# Patient Record
Sex: Female | Born: 2000 | Race: Black or African American | Hispanic: No | Marital: Married | Smoking: Never smoker
Health system: Southern US, Community
[De-identification: ages and names within clinical notes are randomized; demographics above are authoritative.]

## PROBLEM LIST (undated history)

## (undated) DIAGNOSIS — Z789 Other specified health status: Secondary | ICD-10-CM

## (undated) DIAGNOSIS — D649 Anemia, unspecified: Secondary | ICD-10-CM

## (undated) HISTORY — DX: Anemia, unspecified: D64.9

## (undated) HISTORY — PX: WISDOM TOOTH EXTRACTION: SHX21

## (undated) HISTORY — PX: TONSILLECTOMY: SUR1361

---
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
## 2020-01-04 ENCOUNTER — Encounter: Payer: Self-pay | Admitting: Obstetrics

## 2020-01-04 ENCOUNTER — Other Ambulatory Visit: Payer: Self-pay

## 2020-01-04 ENCOUNTER — Ambulatory Visit (INDEPENDENT_AMBULATORY_CARE_PROVIDER_SITE_OTHER): Payer: 59 | Admitting: Obstetrics

## 2020-01-04 ENCOUNTER — Other Ambulatory Visit (HOSPITAL_COMMUNITY)
Admission: RE | Admit: 2020-01-04 | Discharge: 2020-01-04 | Disposition: A | Discharge status: Home / Self Care | Payer: 59 | Source: Ambulatory Visit | Attending: Obstetrics | Admitting: Obstetrics

## 2020-01-04 VITALS — BP 113/69 | HR 87 | Wt 160.0 lb

## 2020-01-04 DIAGNOSIS — O30009 Twin pregnancy, unspecified number of placenta and unspecified number of amniotic sacs, unspecified trimester: Secondary | ICD-10-CM | POA: Diagnosis not present

## 2020-01-04 DIAGNOSIS — O0990 Supervision of high risk pregnancy, unspecified, unspecified trimester: Secondary | ICD-10-CM

## 2020-01-04 DIAGNOSIS — Z3A15 15 weeks gestation of pregnancy: Secondary | ICD-10-CM | POA: Diagnosis not present

## 2020-01-04 DIAGNOSIS — O0992 Supervision of high risk pregnancy, unspecified, second trimester: Secondary | ICD-10-CM

## 2020-01-04 MED ORDER — PRENATE MINI 29-0.6-0.4-350 MG PO CAPS: 1.0000 | ORAL_CAPSULE | Freq: Every day | ORAL | 3 refills | Status: DC

## 2020-01-04 MED ORDER — FOLIC ACID 1 MG PO TABS: 1.0000 mg | ORAL_TABLET | Freq: Every day | ORAL | 3 refills | Status: DC

## 2020-01-04 MED ORDER — FERROUS SULFATE 325 (65 FE) MG PO TABS: 325.0000 mg | ORAL_TABLET | Freq: Two times a day (BID) | ORAL | 5 refills | Status: DC

## 2020-01-04 MED ORDER — BLOOD PRESSURE MONITOR KIT
1.0000 | PACK | 0 refills | Status: DC
Start: 2020-01-04 — End: 2020-06-08

## 2020-01-04 NOTE — Progress Notes (Signed)
NOB TWINS   Genetic Screening: Desires Wants to know gender.    CC: None

## 2020-01-04 NOTE — Addendum Note (Signed)
Addended by: Kennon Portela on: 01/04/2020 02:42 PM   Modules accepted: Orders

## 2020-01-04 NOTE — Progress Notes (Addendum)
Subjective:    Kathy Harris is being seen today for her first obstetrical visit.  This is not a planned pregnancy. She is at [redacted]w[redacted]d gestation. Her obstetrical history is significant for none. Relationship with FOB: significant other, not living together. Patient does intend to breast feed. Pregnancy history fully reviewed.  The information documented in the HPI was reviewed and verified.  Menstrual History: OB History    Gravida  2   Para      Term      Preterm      AB  1   Living        SAB  1   TAB      Ectopic      Multiple      Live Births               Patient's last menstrual period was 09/15/2019.    Past Medical History:  Diagnosis Date  . Medical history non-contributory     Past Surgical History:  Procedure Laterality Date  . TONSILLECTOMY      (Not in a hospital admission)  No Known Allergies  Social History   Tobacco Use  . Smoking status: Never Smoker  . Smokeless tobacco: Never Used  Substance Use Topics  . Alcohol use: Never    Family History  Problem Relation Age of Onset  . Heart murmur Mother   . Diabetes Father   . Hypertension Father      Review of Systems Constitutional: negative for weight loss Gastrointestinal: negative for vomiting Genitourinary:negative for genital lesions and vaginal discharge and dysuria Musculoskeletal:negative for back pain Behavioral/Psych: negative for abusive relationship, depression, illegal drug usage and tobacco use    Objective:    BP 113/69   Pulse 87   Wt 160 lb (72.6 kg)   LMP 09/15/2019   BMI 31.25 kg/m  General Appearance:    Alert, cooperative, no distress, appears stated age  Head:    Normocephalic, without obvious abnormality, atraumatic  Eyes:    PERRL, conjunctiva/corneas clear, EOM's intact, fundi    benign, both eyes  Ears:    Normal TM's and external ear canals, both ears  Nose:   Nares normal, septum midline, mucosa normal, no drainage    or sinus tenderness   Throat:   Lips, mucosa, and tongue normal; teeth and gums normal  Neck:   Supple, symmetrical, trachea midline, no adenopathy;    thyroid:  no enlargement/tenderness/nodules; no carotid   bruit or JVD  Back:     Symmetric, no curvature, ROM normal, no CVA tenderness  Lungs:     Clear to auscultation bilaterally, respirations unlabored  Chest Wall:    No tenderness or deformity   Heart:    Regular rate and rhythm, S1 and S2 normal, no murmur, rub   or gallop  Breast Exam:    No tenderness, masses, or nipple abnormality  Abdomen:     Soft, non-tender, bowel sounds active all four quadrants,    no masses, no organomegaly  Genitalia:    Normal female without lesion, discharge or tenderness  Extremities:   Extremities normal, atraumatic, no cyanosis or edema  Pulses:   2+ and symmetric all extremities  Skin:   Skin color, texture, turgor normal, no rashes or lesions  Lymph nodes:   Cervical, supraclavicular, and axillary nodes normal  Neurologic:   CNII-XII intact, normal strength, sensation and reflexes    throughout      Lab Review Urine pregnancy test  Labs reviewed yes Radiologic studies reviewed yes  Assessment:    Pregnancy at [redacted]w[redacted]d weeks .Twins   Plan:     1. Supervision of high risk pregnancy, antepartum  2. Twin pregnancy, antepartum, unspecified multiple gestation type Rx: - Culture, OB Urine - Genetic Screening - Cervicovaginal ancillary only( [facility]) - Enroll Patient in Babyscripts - CBC/D/Plt+RPR+Rh+ABO+Rub Ab... - AFP, Serum, Open Spina Bifida - [place] MFM OB DETAIL +14 WK; Future - Prenat w/o A-FeCbn-Meth-FA-DHA (PRENATE MINI) 29-0.6-0.4-350 MG CAPS; Take 1 capsule by mouth daily before breakfast.  Dispense: 90 capsule; Refill: 3 - folic acid (FOLVITE) 1 MG tablet; Take 1 tablet (1 mg total) by mouth daily.  Dispense: 90 tablet; Refill: 3 - ferrous sulfate 325 (65 FE) MG tablet; Take 1 tablet (325 mg total) by mouth 2 (two) times daily with a meal.  Dispense:  60 tablet; Refill: 5 - [place] MFM OB DETAIL ADDL GEST +14 WK; Future   Prenatal vitamins.  Counseling provided regarding continued use of seat belts, cessation of alcohol consumption, smoking or use of illicit drugs; infection precautions i.e., influenza/TDAP immunizations, toxoplasmosis,CMV, parvovirus, listeria and varicella; workplace safety, exercise during pregnancy; routine dental care, safe medications, sexual activity, hot tubs, saunas, pools, travel, caffeine use, fish and methlymercury, potential toxins, hair treatments, varicose veins Weight gain recommendations per IOM guidelines reviewed: underweight/BMI< 18.5--> gain 28 - 40 lbs; normal weight/BMI 18.5 - 24.9--> gain 25 - 35 lbs; overweight/BMI 25 - 29.9--> gain 15 - 25 lbs; obese/BMI >30->gain  11 - 20 lbs Problem list reviewed and updated. FIRST/CF mutation testing/NIPT/QUAD SCREEN/fragile X/Ashkenazi Jewish population testing/Spinal muscular atrophy discussed: requested. Role of ultrasound in pregnancy discussed; fetal survey: requested. Amniocentesis discussed: not indicated.  Meds ordered this encounter  Medications  . Prenat w/o A-FeCbn-Meth-FA-DHA (PRENATE MINI) 29-0.6-0.4-350 MG CAPS    Sig: Take 1 capsule by mouth daily before breakfast.    Dispense:  90 capsule    Refill:  3  . folic acid (FOLVITE) 1 MG tablet    Sig: Take 1 tablet (1 mg total) by mouth daily.    Dispense:  90 tablet    Refill:  3  . ferrous sulfate 325 (65 FE) MG tablet    Sig: Take 1 tablet (325 mg total) by mouth 2 (two) times daily with a meal.    Dispense:  60 tablet    Refill:  5   Orders Placed This Encounter  Procedures  . Culture, OB Urine  . [place] MFM OB DETAIL +14 WK    Standing Status:   Future    Standing Expiration Date:   01/03/2021    Order Specific Question:   Reason for Exam (SYMPTOM  OR DIAGNOSIS REQUIRED)    Answer:   Twins    Order Specific Question:   Preferred Location    Answer:   [facility] Ultrasound  . Genetic Screening     PANORAMA  . CBC/D/Plt+RPR+Rh+ABO+Rub Ab...  . AFP, Serum, Open Spina Bifida    Order Specific Question:   Is patient insulin dependent?    Answer:   No    Order Specific Question:   Patient weight (lb.)    Answer:   160 lb (72.6 kg)    Order Specific Question:   Gestational Age (GA), weeks    Answer:   67    Order Specific Question:   Date on which patient was at this GA    Answer:   01/04/2020    Order Specific Question:   GA Calculation  Method    Answer:   LMP    Order Specific Question:   Number of fetuses    Answer:   2    Order Specific Question:   Donor egg?    Answer:   N    Follow up in 4 weeks. 50% of 25 min visit spent on counseling and coordination of care.    Brock Bad, MD 01/04/2020 2:16 PM

## 2020-01-04 NOTE — Addendum Note (Signed)
Addended by: Brock Bad on: 01/04/2020 02:33 PM   Modules accepted: Orders

## 2020-01-05 LAB — CERVICOVAGINAL ANCILLARY ONLY
Bacterial Vaginitis (gardnerella): NEGATIVE
Candida Glabrata: POSITIVE — AB
Candida Vaginitis: POSITIVE — AB
Chlamydia: NEGATIVE
Comment: NEGATIVE
Comment: NEGATIVE
Comment: NEGATIVE
Comment: NEGATIVE
Comment: NEGATIVE
Comment: NORMAL
Neisseria Gonorrhea: NEGATIVE
Trichomonas: NEGATIVE

## 2020-01-06 LAB — CBC/D/PLT+RPR+RH+ABO+RUB AB...
Antibody Screen: NEGATIVE
Basophils Absolute: 0 10*3/uL (ref 0.0–0.2)
Basos: 0 %
EOS (ABSOLUTE): 0 10*3/uL (ref 0.0–0.4)
Eos: 1 %
HCV Ab: <0.1 s/co ratio (ref 0.0–0.9)
HIV Screen 4th Generation wRfx: NONREACTIVE
Hematocrit: 32.2 % — ABNORMAL LOW (ref 34.0–46.6)
Hemoglobin: 10.5 g/dL — ABNORMAL LOW (ref 11.1–15.9)
Hepatitis B Surface Ag: NEGATIVE
Immature Grans (Abs): 0 10*3/uL (ref 0.0–0.1)
Immature Granulocytes: 0 %
Lymphocytes Absolute: 1.3 10*3/uL (ref 0.7–3.1)
Lymphs: 26 %
MCH: 28.2 pg (ref 26.6–33.0)
MCHC: 32.6 g/dL (ref 31.5–35.7)
MCV: 87 fL (ref 79–97)
Monocytes Absolute: 0.7 10*3/uL (ref 0.1–0.9)
Monocytes: 13 %
Neutrophils Absolute: 3 10*3/uL (ref 1.4–7.0)
Neutrophils: 60 %
Platelets: 263 10*3/uL (ref 150–450)
RBC: 3.72 x10E6/uL — ABNORMAL LOW (ref 3.77–5.28)
RDW: 14.1 % (ref 11.7–15.4)
RPR Ser Ql: NONREACTIVE
Rh Factor: POSITIVE
Rubella Antibodies, IGG: 2.02 index (ref >0.99)
WBC: 5 10*3/uL (ref 3.4–10.8)

## 2020-01-06 LAB — AFP, SERUM, OPEN SPINA BIFIDA
AFP MoM: 1.81
AFP Value: 57.5 ng/mL
Gest. Age on Collection Date: 15 weeks
Maternal Age At EDD: 19.5 yr
OSBR Risk 1 IN: 5014
Test Results:: NEGATIVE
Weight: 160 [lb_av]

## 2020-01-06 LAB — HCV INTERPRETATION

## 2020-01-09 ENCOUNTER — Other Ambulatory Visit: Payer: Self-pay | Admitting: Obstetrics

## 2020-01-09 DIAGNOSIS — B3731 Acute candidiasis of vulva and vagina: Secondary | ICD-10-CM

## 2020-01-09 LAB — CULTURE, OB URINE

## 2020-01-09 LAB — URINE CULTURE, OB REFLEX

## 2020-01-09 MED ORDER — TERCONAZOLE 0.4 % VA CREA: 1.0000 | TOPICAL_CREAM | Freq: Every day | VAGINAL | 0 refills | Status: DC

## 2020-01-10 ENCOUNTER — Other Ambulatory Visit: Payer: Self-pay | Admitting: Obstetrics

## 2020-01-10 DIAGNOSIS — O2340 Unspecified infection of urinary tract in pregnancy, unspecified trimester: Secondary | ICD-10-CM

## 2020-01-10 MED ORDER — AMOXICILLIN-POT CLAVULANATE 875-125 MG PO TABS: 1.0000 | ORAL_TABLET | Freq: Two times a day (BID) | ORAL | 0 refills | Status: DC

## 2020-01-16 ENCOUNTER — Encounter: Payer: Self-pay | Admitting: Obstetrics

## 2020-01-17 ENCOUNTER — Telehealth: Payer: Self-pay

## 2020-01-17 NOTE — Telephone Encounter (Signed)
Returned call and advised need to repeat panorama, pt will call back to schedule

## 2020-02-01 ENCOUNTER — Encounter: Payer: Self-pay | Admitting: Obstetrics and Gynecology

## 2020-02-01 ENCOUNTER — Telehealth (INDEPENDENT_AMBULATORY_CARE_PROVIDER_SITE_OTHER): Payer: 59 | Admitting: Obstetrics and Gynecology

## 2020-02-01 DIAGNOSIS — O0992 Supervision of high risk pregnancy, unspecified, second trimester: Secondary | ICD-10-CM | POA: Insufficient documentation

## 2020-02-01 DIAGNOSIS — Z3A19 19 weeks gestation of pregnancy: Secondary | ICD-10-CM

## 2020-02-01 DIAGNOSIS — O30002 Twin pregnancy, unspecified number of placenta and unspecified number of amniotic sacs, second trimester: Secondary | ICD-10-CM

## 2020-02-01 DIAGNOSIS — O30009 Twin pregnancy, unspecified number of placenta and unspecified number of amniotic sacs, unspecified trimester: Secondary | ICD-10-CM

## 2020-02-01 HISTORY — DX: Supervision of high risk pregnancy, unspecified, second trimester: O09.92

## 2020-02-01 MED ORDER — ASPIRIN 81 MG PO CHEW: 81.0000 mg | CHEWABLE_TABLET | Freq: Every day | ORAL | Status: DC

## 2020-02-01 NOTE — Patient Instructions (Signed)
National Guideline Alliance ([place]).Twin and Triplet Pregnancy. [place]: National Institute for Health and Care Excellence ([place]); 2019.">  Multiple Pregnancy Multiple pregnancy means that a woman is carrying more than one baby at a time. She may be pregnant with twins, triplets, or more. The majority of multiple pregnancies are twins. Naturally conceiving triplets or more (higher-order multiples) is rare. Multiple pregnancies are riskier than single pregnancies. A woman with a multiple pregnancy is more likely to have certain problems during her pregnancy. How does a multiple pregnancy happen? A multiple pregnancy happens when:  The woman's body releases more than one egg at a time, and then each egg gets fertilized by a different sperm. ? This is the most common type of multiple pregnancy. ? Twins or other multiples produced this way are called fraternal. They are no more alike than non-multiple siblings are.  One sperm fertilizes one egg, which then divides into more than one embryo. ? Twins or other multiples produced this way are called identical. Identical multiples are always the same gender, and they look very much alike. Who is most likely to have a multiple pregnancy? A multiple pregnancy is more likely to develop in women who:  Have had fertility treatment, especially if the treatment included fertility medicines.  Are older than 19 years of age.  Have already had four or more children.  Have a family history of multiple pregnancy. How is a multiple pregnancy diagnosed? A multiple pregnancy may be diagnosed based on:  Symptoms such as: ? Rapid weight gain in the first 3 months of pregnancy (first trimester). ? More severe nausea and breast tenderness than what is typical of a single pregnancy. ? A larger uterus than what is normal for the stage of the pregnancy.  Blood tests that detect a higher-than-normal level of human chorionic gonadotropin (hCG). This is a hormone that your  body produces in early pregnancy.  An ultrasound exam. This is used to confirm that you are carrying multiples. What risks come with multiple pregnancy? A multiple pregnancy puts you at a higher risk for certain problems during or after your pregnancy. These include:  Delivering your babies before your due date (preterm birth). A full-term pregnancy lasts for at least 37 weeks. ? Babies born before 37 weeks may have a higher risk for breathing problems, feeding difficulties, cerebral palsy, and learning disabilities.  Diabetes.  Preeclampsia. This is a serious condition that causes high blood pressure and headaches during pregnancy.  Too much blood loss after childbirth (postpartum hemorrhage).  Postpartum depression.  Low birth weight of the babies. How will having a multiple pregnancy affect my care? Your health care team will monitor you more closely. You may need more frequent prenatal visits. This will ensure that you are healthy and that your babies are growing normally. Follow these instructions at home: Eating and drinking  Increase your nutrition. ? Follow your health care provider's recommendations for weight gain. You may need to gain a little extra weight when you are pregnant with multiples. ? Eat healthy snacks often throughout the day. This will add calories and reduce nausea.  Drink enough fluid to keep your urine pale yellow.  Take prenatal vitamins. Ask your health care provider what vitamins are right for you. Activity Limit your activities by 20-24 weeks of pregnancy.  Rest often.  Avoid activities, exercise, and work that take a lot of effort.  Ask your health care provider when you should stop having sex. General instructions  Do not use any   products that contain nicotine or tobacco, such as cigarettes, e-cigarettes, and chewing tobacco. If you need help quitting, ask your health care provider.  Do not drink alcohol or use illegal drugs.  Take  over-the-counter and prescription medicines only as told by your health care provider.  Arrange for extra help around the house.  Keep all follow-up visits and all prenatal visits as told by your health care provider. This is important. Where to find more information  American College of Obstetricians and Gynecology: www.acog.org Contact a health care provider if:  You have dizziness.  You have nausea, vomiting, or diarrhea that does not go away.  You have depression or other emotions that are interfering with your normal activities.  You have a fever.  You have pain with urination.  You have a bad-smelling vaginal discharge.  You notice increased swelling in your face, hands, legs, or ankles. Get help right away if:  You have fluid leaking from your vagina.  You have bleeding from your vagina.  You have pelvic cramps, pelvic pressure, or nagging pain in your abdomen or lower back.  You are having regular contractions.  You have a severe headache, with or without changes in how you see.  You have chest pain or shortness of breath.  You notice that your babies move less often, or do not move at all. Summary  Having a multiple pregnancy means that a woman is carrying more than one baby at a time.  A multiple pregnancy puts you at a higher risk for delivering your babies before your due date, having diabetes, preeclampsia, too much blood loss after childbirth, or low birth weight of the babies.  Your health care provider will monitor you more closely during your pregnancy.  You may need to make some lifestyle changes during pregnancy. This includes eating more, limiting your activities after 20-24 weeks of pregnancy, and arranging for extra help around the house.  Follow up with your health care provider as instructed if you experience any complications. This information is not intended to replace advice given to you by your health care provider. Make sure you discuss  any questions you have with your health care provider. Document Revised: 01/24/2019 Document Reviewed: 01/24/2019 Elsevier Patient Education  2020 Elsevier Inc.  

## 2020-02-01 NOTE — Progress Notes (Signed)
   OBSTETRICS PRENATAL VIRTUAL VISIT ENCOUNTER NOTE  Provider location: Center for [facility]   I connected with Laurann Montana on 02/01/20 at  8:30 AM EDT by MyChart Video Encounter at home and verified that I am speaking with the correct person using two identifiers.   I discussed the limitations, risks, security and privacy concerns of performing an evaluation and management service virtually and the availability of in person appointments. I also discussed with the patient that there may be a patient responsible charge related to this service. The patient expressed understanding and agreed to proceed. Subjective:  Kathy Harris is a 19 y.o. G2P0010 at [redacted]w[redacted]d being seen today for ongoing prenatal care.  She is currently monitored for the following issues for this high-risk pregnancy and has Twin pregnancy; Supervision of high risk pregnancy in second trimester; and [redacted] weeks gestation of pregnancy on their problem list.  Patient reports spotting the previous evening.  She denies recent intercourse.  Contractions: Not present. Vag. Bleeding: Scant.  Movement: Present. Denies any leaking of fluid.   Pt is unsure of chorionicity of the gestation.  She states she had 2 u/s in [place], but I could not find anything scanned into media.  The following portions of the patient's history were reviewed and updated as appropriate: allergies, current medications, past family history, past medical history, past social history, past surgical history and problem list.   Objective:  There were no vitals filed for this visit.  Fetal Status:     Movement: Present     General:  Alert, oriented and cooperative. Patient is in no acute distress.  Respiratory: Normal respiratory effort, no problems with respiration noted  Mental Status: Normal mood and affect. Normal behavior. Normal judgment and thought content.  Rest of physical exam deferred due to type of encounter  Imaging: No  results found.  Assessment and Plan:  Pregnancy: G2P0010 at [redacted]w[redacted]d 1. Twin pregnancy, antepartum, unspecified multiple gestation type Pt has anatomy scan on 02/02/2020, pt advised to ask about chorionicity staus - aspirin chewable tablet 81 mg  2. Supervision of high risk pregnancy in second trimester AFP previously completed and was negative  3. [redacted] weeks gestation of pregnancy   Preterm labor symptoms and general obstetric precautions including but not limited to vaginal bleeding, contractions, leaking of fluid and fetal movement were reviewed in detail with the patient. I discussed the assessment and treatment plan with the patient. The patient was provided an opportunity to ask questions and all were answered. The patient agreed with the plan and demonstrated an understanding of the instructions. The patient was advised to call back or seek an in-person office evaluation/go to [facility]U at [facility] for any urgent or concerning symptoms. Please refer to After Visit Summary for other counseling recommendations.   I provided 15 minutes of face-to-face time during this encounter.  Return in about 4 weeks (around 02/29/2020) for [facility], in person.  Future Appointments  Date Time Provider Department Center  02/02/2020  8:45 AM [facility] NURSE [facility]  02/02/2020  9:00 AM [facility] US1 [facility]    Warden Fillers, MD Center for [employer], [facility]

## 2020-02-01 NOTE — Progress Notes (Signed)
Virtual ROB     CC: pt had bright red bleeding last night when wiping . Last had  intercourse last week. No bleeding today.   Pt not able to check b/p at this time.

## 2020-02-02 ENCOUNTER — Ambulatory Visit: Payer: 59 | Attending: Obstetrics

## 2020-02-02 ENCOUNTER — Ambulatory Visit: Payer: 59 | Admitting: *Deleted

## 2020-02-02 ENCOUNTER — Other Ambulatory Visit: Payer: Self-pay

## 2020-02-02 ENCOUNTER — Ambulatory Visit: Payer: 59

## 2020-02-02 ENCOUNTER — Encounter: Payer: Self-pay | Admitting: *Deleted

## 2020-02-02 ENCOUNTER — Other Ambulatory Visit: Payer: Self-pay | Admitting: *Deleted

## 2020-02-02 VITALS — BP 106/72 | HR 87

## 2020-02-02 DIAGNOSIS — O30049 Twin pregnancy, dichorionic/diamniotic, unspecified trimester: Secondary | ICD-10-CM

## 2020-02-02 DIAGNOSIS — O99212 Obesity complicating pregnancy, second trimester: Secondary | ICD-10-CM

## 2020-02-02 DIAGNOSIS — O30002 Twin pregnancy, unspecified number of placenta and unspecified number of amniotic sacs, second trimester: Secondary | ICD-10-CM

## 2020-02-02 DIAGNOSIS — E669 Obesity, unspecified: Secondary | ICD-10-CM

## 2020-02-02 DIAGNOSIS — O30009 Twin pregnancy, unspecified number of placenta and unspecified number of amniotic sacs, unspecified trimester: Secondary | ICD-10-CM | POA: Insufficient documentation

## 2020-02-02 DIAGNOSIS — O30042 Twin pregnancy, dichorionic/diamniotic, second trimester: Secondary | ICD-10-CM | POA: Diagnosis not present

## 2020-02-02 DIAGNOSIS — Z363 Encounter for antenatal screening for malformations: Secondary | ICD-10-CM | POA: Diagnosis not present

## 2020-02-02 DIAGNOSIS — Z3A20 20 weeks gestation of pregnancy: Secondary | ICD-10-CM

## 2020-02-14 ENCOUNTER — Telehealth: Payer: Self-pay | Admitting: Genetic Counselor

## 2020-02-14 ENCOUNTER — Other Ambulatory Visit: Payer: Self-pay

## 2020-02-14 ENCOUNTER — Encounter (HOSPITAL_COMMUNITY): Payer: Self-pay | Admitting: Obstetrics and Gynecology

## 2020-02-14 ENCOUNTER — Inpatient Hospital Stay (HOSPITAL_COMMUNITY)
Admission: AD | Admit: 2020-02-14 | Discharge: 2020-02-14 | Disposition: A | Discharge status: Home / Self Care | Payer: 59 | Attending: Obstetrics and Gynecology | Admitting: Obstetrics and Gynecology

## 2020-02-14 DIAGNOSIS — O26899 Other specified pregnancy related conditions, unspecified trimester: Secondary | ICD-10-CM

## 2020-02-14 DIAGNOSIS — O26892 Other specified pregnancy related conditions, second trimester: Secondary | ICD-10-CM | POA: Diagnosis not present

## 2020-02-14 DIAGNOSIS — R109 Unspecified abdominal pain: Secondary | ICD-10-CM | POA: Diagnosis not present

## 2020-02-14 DIAGNOSIS — O4692 Antepartum hemorrhage, unspecified, second trimester: Secondary | ICD-10-CM | POA: Diagnosis present

## 2020-02-14 DIAGNOSIS — Z3A22 22 weeks gestation of pregnancy: Secondary | ICD-10-CM

## 2020-02-14 DIAGNOSIS — O30042 Twin pregnancy, dichorionic/diamniotic, second trimester: Secondary | ICD-10-CM | POA: Diagnosis not present

## 2020-02-14 LAB — URINALYSIS, ROUTINE W REFLEX MICROSCOPIC
Bilirubin Urine: NEGATIVE
Glucose, UA: NEGATIVE mg/dL
Hgb urine dipstick: NEGATIVE
Ketones, ur: NEGATIVE mg/dL
Leukocytes,Ua: NEGATIVE
Nitrite: NEGATIVE
Protein, ur: NEGATIVE mg/dL
Specific Gravity, Urine: 1.014 (ref 1.005–1.030)
pH: 6 (ref 5.0–8.0)

## 2020-02-14 NOTE — MAU Note (Signed)
Pt states that she was in the middle of work today around 1415. Pt reports a co-worker bumped into her belly and then after went to the bathroom and saw some blood when wiping. Pt reports there was also some blood in her underwear.   Denies LOF.

## 2020-02-14 NOTE — Telephone Encounter (Signed)
LVM for Kathy Harris informing her that I was calling to discuss results from her repeat Panorama. Requested a call back to my direct line to discuss these in more detail, as no identifiers were provided in voicemail message.   Gershon Crane, MS, [facility] Genetic Counselor

## 2020-02-14 NOTE — Discharge Instructions (Signed)
Abdominal Pain During Pregnancy  Belly (abdominal) pain is common during pregnancy. There are many possible causes. Most of the time, it is not a serious problem. Other times, it can be a sign that something is wrong with the pregnancy. Always tell your doctor if you have belly pain. Follow these instructions at home:  Do not have sex or put anything in your vagina until your pain goes away completely.  Get plenty of rest until your pain gets better.  Drink enough fluid to keep your pee (urine) pale yellow.  Take over-the-counter and prescription medicines only as told by your doctor.  Keep all follow-up visits as told by your doctor. This is important. Contact a doctor if:  Your pain continues or gets worse after resting.  You have lower belly pain that: ? Comes and goes at regular times. ? Spreads to your back. ? Feels like menstrual cramps.  You have pain or burning when you pee (urinate). Get help right away if:  You have a fever or chills.  You have vaginal bleeding.  You are leaking fluid from your vagina.  You are passing tissue from your vagina.  You throw up (vomit) for more than 24 hours.  You have watery poop (diarrhea) for more than 24 hours.  Your baby is moving less than usual.  You feel very weak or faint.  You have shortness of breath.  You have very bad pain in your upper belly. Summary  Belly (abdominal) pain is common during pregnancy. There are many possible causes.  If you have belly pain during pregnancy, tell your doctor right away.  Keep all follow-up visits as told by your doctor. This is important. This information is not intended to replace advice given to you by your health care provider. Make sure you discuss any questions you have with your health care provider. Document Revised: 09/20/2018 Document Reviewed: 09/04/2016 Elsevier Patient Education  2020 Elsevier Inc.  

## 2020-02-14 NOTE — MAU Provider Note (Signed)
History     CSN: [id number]  Arrival date and time: 02/14/20 1542   First Provider Initiated Contact with Patient 02/14/20 1614      Chief Complaint  Patient presents with  . Vaginal Bleeding   19 y.o. G2P0010 '@22' .5 wks with didi twins presenting with spotting. Reports seeing some pink on the toilet paper and her underwear this afternoon after she was accidentally bumped in her abdomen by her co-worker. Reports abd cramping since the event. Rates pain 2/10. Has not taken anything for it.   OB History    Gravida  2   Para      Term      Preterm      AB  1   Living        SAB  1   TAB      Ectopic      Multiple      Live Births              Past Medical History:  Diagnosis Date  . Medical history non-contributory     Past Surgical History:  Procedure Laterality Date  . TONSILLECTOMY    . WISDOM TOOTH EXTRACTION      Family History  Problem Relation Age of Onset  . Heart murmur Mother   . Diabetes Father   . Hypertension Father     Social History   Tobacco Use  . Smoking status: Never Smoker  . Smokeless tobacco: Never Used  Vaping Use  . Vaping Use: Never used  Substance Use Topics  . Alcohol use: Never  . Drug use: Never    Allergies: No Known Allergies  No medications prior to admission.    Review of Systems  Gastrointestinal: Positive for abdominal pain.  Genitourinary: Positive for vaginal bleeding.   Physical Exam   Blood pressure 106/64, pulse 93, temperature 98.7 F (37.1 C), temperature source Oral, resp. rate (!) 24, weight 80.5 kg, last menstrual period 09/08/2019, SpO2 99 %.  Physical Exam Vitals and nursing note reviewed. Exam conducted with a chaperone present.  Constitutional:      Appearance: Normal appearance.  HENT:     Head: Normocephalic and atraumatic.  Pulmonary:     Effort: Pulmonary effort is normal. No respiratory distress.  Abdominal:     Palpations: Abdomen is soft.     Tenderness: There is no  abdominal tenderness.  Genitourinary:    Comments: External: no lesions or erythema Vagina: rugated, pink, moist, scant white discharge, no blood Cervix closed/thick  Musculoskeletal:        General: Normal range of motion.     Cervical back: Normal range of motion.  Skin:    General: Skin is warm and dry.  Neurological:     General: No focal deficit present.     Mental Status: She is alert and oriented to person, place, and time.  Psychiatric:        Mood and Affect: Mood normal.   FHT A-144 FHT B-151  Results for orders placed or performed during the hospital encounter of 02/14/20 (from the past 24 hour(s))  Urinalysis, Routine w reflex microscopic Urine, Clean Catch     Status: None   Collection Time: 02/14/20  4:07 PM  Result Value Ref Range   Color, Urine YELLOW YELLOW   APPearance CLEAR CLEAR   Specific Gravity, Urine 1.014 1.005 - 1.030   pH 6.0 5.0 - 8.0   Glucose, UA NEGATIVE NEGATIVE mg/dL   Hgb urine dipstick NEGATIVE  NEGATIVE   Bilirubin Urine NEGATIVE NEGATIVE   Ketones, ur NEGATIVE NEGATIVE mg/dL   Protein, ur NEGATIVE NEGATIVE mg/dL   Nitrite NEGATIVE NEGATIVE   Leukocytes,Ua NEGATIVE NEGATIVE   MAU Course  Procedures  MDM No signs of UTI, VB, abruption or PTL. Stable for discharge home.   Assessment and Plan   1. [redacted] weeks gestation of pregnancy   2. Abdominal cramping affecting pregnancy    Discharge home Follow up at [facility] as scheduled PTL/abruption precautions  Allergies as of 02/14/2020   No Known Allergies     Medication List    STOP taking these medications   amoxicillin-clavulanate 875-125 MG tablet Commonly known as: Augmentin   metoCLOPramide 10 MG tablet Commonly known as: REGLAN   Prenate Mini 29-0.6-0.4-350 MG Caps   terconazole 0.4 % vaginal cream Commonly known as: Terazol 7     TAKE these medications   Blood Pressure Monitor Kit 1 Device by Does not apply route once a week. To be monitored Regularly at home.    ferrous sulfate 325 (65 FE) MG tablet Take 1 tablet (325 mg total) by mouth 2 (two) times daily with a meal.   folic acid 1 MG tablet Commonly known as: FOLVITE Take 1 tablet (1 mg total) by mouth daily.   prenatal multivitamin Tabs tablet Take 1 tablet by mouth daily at 12 noon.      Julianne Handler, CNM 02/14/2020, 5:20 PM

## 2020-02-15 ENCOUNTER — Telehealth: Payer: Self-pay | Admitting: Genetic Counselor

## 2020-02-15 NOTE — Telephone Encounter (Signed)
Received a call back from [name] yesterday evening so I returned her call this morning to discuss her repeat Panorama results. An atypical finding outside of the scope of the test was identified on Panorama. We discussed that there are several possibilities for why she could have gotten this type of result. Firstly, one or both of the fetuses could have a chromosome abnormality that could not be detected by the screen, such as small deletions or duplications. It is also possible that a chromosome abnormality may be present in some cells of the body, but other cells may have a normal chromosomal make-up. This is a phenomenon called mosaicism. Secondly, [name] herself could have a chromosome abnormality that could not be detected by the screen. Thirdly, one or both of the fetus's placentas could have a chromosome abnormality that the fetuses do not have. This is a phenomenon known as confined placental mosaicism. Finally, this may be a false positive result.  I informed [name] that I had contacted a genetic counselor at the laboratory [facility] to try to get additional information about this finding. They told me that it was not possible to determine a specific chromosome of interest or whether this finding is suspected to be maternal or fetal in origin. We discussed that without knowing if one or both of the fetuses have a chromosome abnormality, it is difficult to say whether or not they could have problems with their health or development. Some chromosome abnormalities affected health while others are benign and can be familial findings.  We reviewed additional testing options that are available to try to get more information during the pregnancy. Firstly, we discussed the possibility of amniocentesis. We reviewed the risks and benefits associated with the procedure and I informed her that since the twins have their own separate amniotic sacs, a sample would have to be taken from each in order to get  information about both twins' chromosomes. [name] was made aware that this was the only option that could definitively tell us whether or not the fetuses have a chromosome abnormality. Secondly, we discussed the option of a maternal karyotype. This is a blood type that would analyze [name] chromosomes to determine if she has a chromosomal abnormality that could contribute to this Panorama result. Finally, we reviewed that she may continue to monitor the pregnancy via standard ultrasounds and pursue postnatal genetic testing if clinically indicated. [name] declined amniocentesis and maternal karyotype at this time. She is aware that both tests are available through the rest of her pregnancy should she change her mind.   I offered to send [name] an email summarizing our discussion today, which she expressed interest in. I sent this to her following our phone call. She confirmed that she had no further questions at this time.  Gershon Crane, MS, [facility] Genetic Counselor

## 2020-02-23 ENCOUNTER — Ambulatory Visit: Payer: BLUE CROSS/BLUE SHIELD | Attending: Obstetrics and Gynecology

## 2020-02-23 ENCOUNTER — Ambulatory Visit: Payer: BLUE CROSS/BLUE SHIELD | Admitting: *Deleted

## 2020-02-23 ENCOUNTER — Other Ambulatory Visit: Payer: Self-pay

## 2020-02-23 ENCOUNTER — Other Ambulatory Visit: Payer: Self-pay | Admitting: *Deleted

## 2020-02-23 DIAGNOSIS — O30042 Twin pregnancy, dichorionic/diamniotic, second trimester: Secondary | ICD-10-CM

## 2020-02-23 DIAGNOSIS — O30049 Twin pregnancy, dichorionic/diamniotic, unspecified trimester: Secondary | ICD-10-CM | POA: Insufficient documentation

## 2020-02-23 DIAGNOSIS — O322XX2 Maternal care for transverse and oblique lie, fetus 2: Secondary | ICD-10-CM

## 2020-02-23 DIAGNOSIS — E669 Obesity, unspecified: Secondary | ICD-10-CM | POA: Diagnosis not present

## 2020-02-23 DIAGNOSIS — Z362 Encounter for other antenatal screening follow-up: Secondary | ICD-10-CM

## 2020-02-23 DIAGNOSIS — O99212 Obesity complicating pregnancy, second trimester: Secondary | ICD-10-CM

## 2020-02-23 DIAGNOSIS — O30002 Twin pregnancy, unspecified number of placenta and unspecified number of amniotic sacs, second trimester: Secondary | ICD-10-CM | POA: Diagnosis present

## 2020-02-23 DIAGNOSIS — O289 Unspecified abnormal findings on antenatal screening of mother: Secondary | ICD-10-CM

## 2020-02-23 DIAGNOSIS — Z3A23 23 weeks gestation of pregnancy: Secondary | ICD-10-CM

## 2020-02-29 ENCOUNTER — Other Ambulatory Visit: Payer: Self-pay

## 2020-02-29 ENCOUNTER — Encounter: Payer: Self-pay | Admitting: Obstetrics

## 2020-02-29 ENCOUNTER — Ambulatory Visit (INDEPENDENT_AMBULATORY_CARE_PROVIDER_SITE_OTHER): Payer: Medicaid Other | Admitting: Obstetrics

## 2020-02-29 VITALS — BP 105/63 | HR 101 | Wt 178.4 lb

## 2020-02-29 DIAGNOSIS — Z3A24 24 weeks gestation of pregnancy: Secondary | ICD-10-CM

## 2020-02-29 DIAGNOSIS — O30042 Twin pregnancy, dichorionic/diamniotic, second trimester: Secondary | ICD-10-CM

## 2020-02-29 DIAGNOSIS — O0990 Supervision of high risk pregnancy, unspecified, unspecified trimester: Secondary | ICD-10-CM

## 2020-02-29 NOTE — Progress Notes (Signed)
Twin pregnancy ROB visit Flu vaccine offered; pt declined No complaints per pt

## 2020-02-29 NOTE — Progress Notes (Signed)
Subjective:  Kathy Harris is a 19 y.o. G2P0010 at [redacted]w[redacted]d being seen today for ongoing prenatal care.  She is currently monitored for the following issues for this high-risk pregnancy and has Twin pregnancy; Supervision of high risk pregnancy in second trimester; and [redacted] weeks gestation of pregnancy on their problem list.  Patient reports no complaints.  Contractions: Not present. Vag. Bleeding: None.  Movement: Present. Denies leaking of fluid.   The following portions of the patient's history were reviewed and updated as appropriate: allergies, current medications, past family history, past medical history, past social history, past surgical history and problem list. Problem list updated.  Objective:   Vitals:   02/29/20 1313  BP: 105/63  Pulse: (!) 101  Weight: 178 lb 6.4 oz (80.9 kg)    Fetal Status:     Movement: Present     General:  Alert, oriented and cooperative. Patient is in no acute distress.  Skin: Skin is warm and dry. No rash noted.   Cardiovascular: Normal heart rate noted  Respiratory: Normal respiratory effort, no problems with respiration noted  Abdomen: Soft, gravid, appropriate for gestational age. Pain/Pressure: Absent     Pelvic:  Cervical exam deferred        Extremities: Normal range of motion.  Edema: None  Mental Status: Normal mood and affect. Normal behavior. Normal judgment and thought content.   Urinalysis:      Assessment and Plan:  Pregnancy: G2P0010 at [redacted]w[redacted]d  1. Supervision of high risk pregnancy, antepartum  2. Dichorionic diamniotic twin pregnancy in second trimester   Preterm labor symptoms and general obstetric precautions including but not limited to vaginal bleeding, contractions, leaking of fluid and fetal movement were reviewed in detail with the patient. Please refer to After Visit Summary for other counseling recommendations.   Return in about 2 weeks (around 03/14/2020) for MyChart HOB-Faculty Only.   Brock Bad, MD   02/29/20

## 2020-03-22 ENCOUNTER — Ambulatory Visit: Payer: Medicaid Other | Admitting: *Deleted

## 2020-03-22 ENCOUNTER — Other Ambulatory Visit: Payer: Self-pay

## 2020-03-22 ENCOUNTER — Ambulatory Visit: Payer: Medicaid Other | Attending: Obstetrics and Gynecology

## 2020-03-22 DIAGNOSIS — O30042 Twin pregnancy, dichorionic/diamniotic, second trimester: Secondary | ICD-10-CM | POA: Insufficient documentation

## 2020-03-22 DIAGNOSIS — O30049 Twin pregnancy, dichorionic/diamniotic, unspecified trimester: Secondary | ICD-10-CM | POA: Insufficient documentation

## 2020-03-22 DIAGNOSIS — O99212 Obesity complicating pregnancy, second trimester: Secondary | ICD-10-CM

## 2020-03-22 DIAGNOSIS — Z363 Encounter for antenatal screening for malformations: Secondary | ICD-10-CM | POA: Diagnosis not present

## 2020-03-22 DIAGNOSIS — O289 Unspecified abnormal findings on antenatal screening of mother: Secondary | ICD-10-CM

## 2020-03-22 DIAGNOSIS — Z3A27 27 weeks gestation of pregnancy: Secondary | ICD-10-CM

## 2020-03-22 DIAGNOSIS — Z362 Encounter for other antenatal screening follow-up: Secondary | ICD-10-CM

## 2020-03-23 ENCOUNTER — Other Ambulatory Visit: Payer: Self-pay | Admitting: *Deleted

## 2020-03-23 DIAGNOSIS — O30049 Twin pregnancy, dichorionic/diamniotic, unspecified trimester: Secondary | ICD-10-CM

## 2020-03-28 ENCOUNTER — Encounter: Payer: Medicaid Other | Admitting: Obstetrics and Gynecology

## 2020-03-30 ENCOUNTER — Encounter: Payer: Self-pay | Admitting: Obstetrics

## 2020-03-30 ENCOUNTER — Other Ambulatory Visit: Payer: Medicaid Other

## 2020-03-30 ENCOUNTER — Other Ambulatory Visit: Payer: Self-pay

## 2020-03-30 ENCOUNTER — Ambulatory Visit (INDEPENDENT_AMBULATORY_CARE_PROVIDER_SITE_OTHER): Payer: Medicaid Other | Admitting: Obstetrics

## 2020-03-30 VITALS — BP 115/74 | HR 96 | Wt 187.0 lb

## 2020-03-30 DIAGNOSIS — O0990 Supervision of high risk pregnancy, unspecified, unspecified trimester: Secondary | ICD-10-CM

## 2020-03-30 DIAGNOSIS — O30042 Twin pregnancy, dichorionic/diamniotic, second trimester: Secondary | ICD-10-CM

## 2020-03-30 NOTE — Progress Notes (Signed)
ROB [redacted]w[redacted]d   Twins   Pt needs 2 hr GTT today. Was not scheduled   CC: None

## 2020-03-30 NOTE — Progress Notes (Signed)
Subjective:  Kathy Harris is a 19 y.o. G2P0010 at [redacted]w[redacted]d being seen today for ongoing prenatal care.  She is currently monitored for the following issues for this high-risk pregnancy and has Twin pregnancy; Supervision of high risk pregnancy in second trimester; and [redacted] weeks gestation of pregnancy on their problem list.  Patient reports no complaints.  Contractions: Irritability. Vag. Bleeding: None.  Movement: Present. Denies leaking of fluid.   The following portions of the patient's history were reviewed and updated as appropriate: allergies, current medications, past family history, past medical history, past social history, past surgical history and problem list. Problem list updated.  Objective:   Vitals:   03/30/20 0814  BP: 115/74  Pulse: 96  Weight: 187 lb (84.8 kg)    Fetal Status:     Movement: Present     General:  Alert, oriented and cooperative. Patient is in no acute distress.  Skin: Skin is warm and dry. No rash noted.   Cardiovascular: Normal heart rate noted  Respiratory: Normal respiratory effort, no problems with respiration noted  Abdomen: Soft, gravid, appropriate for gestational age. Pain/Pressure: Present     Pelvic:  Cervical exam deferred        Extremities: Normal range of motion.  Edema: None  Mental Status: Normal mood and affect. Normal behavior. Normal judgment and thought content.   Urinalysis:      Assessment and Plan:  Pregnancy: G2P0010 at [redacted]w[redacted]d  1. Supervision of high risk pregnancy, antepartum   2. Dichorionic diamniotic twin pregnancy in second trimester Rx: - Glucose Tolerance, 2 Hours w/1 Hour - CBC - HIV Antibody (routine testing w rflx) - RPR   Preterm labor symptoms and general obstetric precautions including but not limited to vaginal bleeding, contractions, leaking of fluid and fetal movement were reviewed in detail with the patient. Please refer to After Visit Summary for other counseling recommendations.   Return in about  2 weeks (around 04/13/2020) for [facility].   Brock Bad, MD  03/30/20

## 2020-03-31 LAB — GLUCOSE TOLERANCE, 2 HOURS W/ 1HR
Glucose, 1 hour: 128 mg/dL (ref 65–179)
Glucose, 2 hour: 111 mg/dL (ref 65–152)
Glucose, Fasting: 81 mg/dL (ref 65–91)

## 2020-03-31 LAB — CBC
Hematocrit: 32.4 % — ABNORMAL LOW (ref 34.0–46.6)
Hemoglobin: 10.9 g/dL — ABNORMAL LOW (ref 11.1–15.9)
MCH: 31.2 pg (ref 26.6–33.0)
MCHC: 33.6 g/dL (ref 31.5–35.7)
MCV: 93 fL (ref 79–97)
Platelets: 206 10*3/uL (ref 150–450)
RBC: 3.49 x10E6/uL — ABNORMAL LOW (ref 3.77–5.28)
RDW: 13.9 % (ref 11.7–15.4)
WBC: 7.3 10*3/uL (ref 3.4–10.8)

## 2020-03-31 LAB — RPR: RPR Ser Ql: NONREACTIVE

## 2020-03-31 LAB — HIV ANTIBODY (ROUTINE TESTING W REFLEX): HIV Screen 4th Generation wRfx: NONREACTIVE

## 2020-04-01 ENCOUNTER — Other Ambulatory Visit: Payer: Self-pay | Admitting: Obstetrics

## 2020-04-01 DIAGNOSIS — O30009 Twin pregnancy, unspecified number of placenta and unspecified number of amniotic sacs, unspecified trimester: Secondary | ICD-10-CM

## 2020-04-01 MED ORDER — FERROUS SULFATE 325 (65 FE) MG PO TABS: 325.0000 mg | ORAL_TABLET | Freq: Two times a day (BID) | ORAL | 5 refills | Status: DC

## 2020-04-02 ENCOUNTER — Telehealth: Payer: Self-pay

## 2020-04-02 NOTE — Telephone Encounter (Signed)
TC to pt regarding recent 2 hr GTT results and anemia as advised by Dr.Harper. Pt states she is already aware of anemia and already taking iron Rx. Pt advised on recent value and that level is still low and to continue taking iron and try iron rich foods as well. Pt voiced understanding.

## 2020-04-13 ENCOUNTER — Encounter: Payer: Medicaid Other | Admitting: Obstetrics and Gynecology

## 2020-04-19 ENCOUNTER — Other Ambulatory Visit: Payer: Self-pay

## 2020-04-19 ENCOUNTER — Ambulatory Visit: Payer: Medicaid Other | Attending: Obstetrics and Gynecology

## 2020-04-19 ENCOUNTER — Other Ambulatory Visit: Payer: Self-pay | Admitting: *Deleted

## 2020-04-19 ENCOUNTER — Encounter: Payer: Self-pay | Admitting: *Deleted

## 2020-04-19 ENCOUNTER — Ambulatory Visit: Payer: Medicaid Other | Admitting: *Deleted

## 2020-04-19 VITALS — BP 118/63 | HR 94

## 2020-04-19 DIAGNOSIS — O30049 Twin pregnancy, dichorionic/diamniotic, unspecified trimester: Secondary | ICD-10-CM | POA: Insufficient documentation

## 2020-04-19 DIAGNOSIS — O99213 Obesity complicating pregnancy, third trimester: Secondary | ICD-10-CM

## 2020-04-19 DIAGNOSIS — O321XX1 Maternal care for breech presentation, fetus 1: Secondary | ICD-10-CM

## 2020-04-19 DIAGNOSIS — O30043 Twin pregnancy, dichorionic/diamniotic, third trimester: Secondary | ICD-10-CM | POA: Insufficient documentation

## 2020-04-19 DIAGNOSIS — Z3A31 31 weeks gestation of pregnancy: Secondary | ICD-10-CM | POA: Diagnosis not present

## 2020-04-19 DIAGNOSIS — Z362 Encounter for other antenatal screening follow-up: Secondary | ICD-10-CM

## 2020-04-19 DIAGNOSIS — O289 Unspecified abnormal findings on antenatal screening of mother: Secondary | ICD-10-CM

## 2020-04-27 ENCOUNTER — Ambulatory Visit (INDEPENDENT_AMBULATORY_CARE_PROVIDER_SITE_OTHER): Payer: Medicaid Other | Admitting: Obstetrics

## 2020-04-27 ENCOUNTER — Other Ambulatory Visit: Payer: Self-pay

## 2020-04-27 ENCOUNTER — Encounter: Payer: Self-pay | Admitting: Obstetrics

## 2020-04-27 VITALS — BP 113/70 | HR 95 | Wt 200.7 lb

## 2020-04-27 DIAGNOSIS — M549 Dorsalgia, unspecified: Secondary | ICD-10-CM

## 2020-04-27 DIAGNOSIS — O0990 Supervision of high risk pregnancy, unspecified, unspecified trimester: Secondary | ICD-10-CM

## 2020-04-27 DIAGNOSIS — O30043 Twin pregnancy, dichorionic/diamniotic, third trimester: Secondary | ICD-10-CM

## 2020-04-27 MED ORDER — COMFORT FIT MATERNITY SUPP SM MISC: 0 refills | Status: DC

## 2020-04-27 NOTE — Progress Notes (Signed)
Subjective:  Kathy Harris is a 19 y.o. G2P0010 at [redacted]w[redacted]d being seen today for ongoing prenatal care.  She is currently monitored for the following issues for this high-risk pregnancy and has Twin pregnancy; Supervision of high risk pregnancy in second trimester; and [redacted] weeks gestation of pregnancy on their problem list.  Patient reports backache.  Contractions: Not present. Vag. Bleeding: None.  Movement: Present. Denies leaking of fluid.   The following portions of the patient's history were reviewed and updated as appropriate: allergies, current medications, past family history, past medical history, past social history, past surgical history and problem list. Problem list updated.  Objective:   Vitals:   04/27/20 1117  BP: 113/70  Pulse: 95  Weight: 200 lb 11.2 oz (91 kg)    Fetal Status:     Movement: Present     General:  Alert, oriented and cooperative. Patient is in no acute distress.  Skin: Skin is warm and dry. No rash noted.   Cardiovascular: Normal heart rate noted  Respiratory: Normal respiratory effort, no problems with respiration noted  Abdomen: Soft, gravid, appropriate for gestational age. Pain/Pressure: Absent     Pelvic:  Cervical exam deferred        Extremities: Normal range of motion.  Edema: None  Mental Status: Normal mood and affect. Normal behavior. Normal judgment and thought content.   Urinalysis:      Assessment and Plan:  Pregnancy: G2P0010 at [redacted]w[redacted]d  1. Supervision of high risk pregnancy, antepartum  2. Dichorionic diamniotic twin pregnancy in third trimester - followed by MFM with EFW's at 36-37 th percentiles and normal fluid.  Breech / Breech on last ultrasound  3. Backache symptom Rx: - Elastic Bandages & Supports (COMFORT FIT MATERNITY SUPP SM) MISC; Wear as directed.  Dispense: 1 each; Refill: 0   Preterm labor symptoms and general obstetric precautions including but not limited to vaginal bleeding, contractions, leaking of fluid and  fetal movement were reviewed in detail with the patient. Please refer to After Visit Summary for other counseling recommendations.   Return in about 2 weeks (around 05/11/2020) for [facility]. Brock Bad, MD  04/27/20

## 2020-05-14 ENCOUNTER — Other Ambulatory Visit: Payer: Self-pay

## 2020-05-14 ENCOUNTER — Ambulatory Visit (INDEPENDENT_AMBULATORY_CARE_PROVIDER_SITE_OTHER): Payer: Medicaid Other | Admitting: Obstetrics and Gynecology

## 2020-05-14 ENCOUNTER — Encounter: Payer: Self-pay | Admitting: Obstetrics and Gynecology

## 2020-05-14 VITALS — BP 114/65 | HR 109 | Wt 203.0 lb

## 2020-05-14 DIAGNOSIS — O30043 Twin pregnancy, dichorionic/diamniotic, third trimester: Secondary | ICD-10-CM

## 2020-05-14 DIAGNOSIS — Z3A34 34 weeks gestation of pregnancy: Secondary | ICD-10-CM

## 2020-05-14 DIAGNOSIS — O0992 Supervision of high risk pregnancy, unspecified, second trimester: Secondary | ICD-10-CM

## 2020-05-14 NOTE — Patient Instructions (Signed)
[facility] [address] [place],  [place]  [address] Main: [phone] 

## 2020-05-14 NOTE — Progress Notes (Signed)
ROB   CC: Swelling, Breast Leaking no breast pain.  Pt wants to discuss Maternity Leave.

## 2020-05-14 NOTE — Progress Notes (Signed)
   PRENATAL VISIT NOTE  Subjective:  Kathy Harris is a 19 y.o. G2P0010 at [redacted]w[redacted]d being seen today for ongoing prenatal care.  She is currently monitored for the following issues for this high-risk pregnancy and has Twin pregnancy; Supervision of high risk pregnancy in second trimester; and [redacted] weeks gestation of pregnancy on their problem list.  Patient reports fatigue and occasional contractions.  Contractions: Irritability. Vag. Bleeding: None.  Movement: Present. Denies leaking of fluid.   The following portions of the patient's history were reviewed and updated as appropriate: allergies, current medications, past family history, past medical history, past social history, past surgical history and problem list.   Objective:   Vitals:   05/14/20 1107  BP: 114/65  Pulse: (!) 109  Weight: 203 lb (92.1 kg)    Fetal Status: Fetal Heart Rate (bpm): A:147B:156   Movement: Present     General:  Alert, oriented and cooperative. Patient is in no acute distress.  Skin: Skin is warm and dry. No rash noted.   Cardiovascular: Normal heart rate noted  Respiratory: Normal respiratory effort, no problems with respiration noted  Abdomen: Soft, gravid, appropriate for gestational age.  Pain/Pressure: Absent     Pelvic: Cervical exam deferred        Extremities: Normal range of motion.  Edema: Moderate pitting, indentation subsides rapidly  Mental Status: Normal mood and affect. Normal behavior. Normal judgment and thought content.   Assessment and Plan:  Pregnancy: G2P0010 at [redacted]w[redacted]d  1. Supervision of high risk pregnancy in second trimester   2. Dichorionic diamniotic twin pregnancy in third trimester - Reviewed mode of delivery, recommendation for vaginal delivery if Twin A is cephalic, CS if Twin A is breech/transverse - she verbalizes understanding of the above, would like CS, I reviewed risks of CS and implications for future pregnancy  3. [redacted] weeks gestation of pregnancy   Preterm  labor symptoms and general obstetric precautions including but not limited to vaginal bleeding, contractions, leaking of fluid and fetal movement were reviewed in detail with the patient. Please refer to After Visit Summary for other counseling recommendations.   Return in about 10 days (around 05/24/2020) for high OB, in person.  Future Appointments  Date Time Provider Department Center  05/18/2020 11:00 AM [facility] NURSE [facility]  05/18/2020 11:15 AM [facility] US2 [facility]  05/25/2020 11:15 AM [facility] NURSE [facility]  05/25/2020 11:30 AM [facility] US3 [facility]    Conan Bowens, MD

## 2020-05-18 ENCOUNTER — Other Ambulatory Visit: Payer: Self-pay

## 2020-05-18 ENCOUNTER — Encounter: Payer: Self-pay | Admitting: *Deleted

## 2020-05-18 ENCOUNTER — Ambulatory Visit: Payer: Medicaid Other | Attending: Obstetrics and Gynecology

## 2020-05-18 ENCOUNTER — Ambulatory Visit: Payer: Medicaid Other | Admitting: *Deleted

## 2020-05-18 VITALS — BP 109/60 | HR 99

## 2020-05-18 DIAGNOSIS — E669 Obesity, unspecified: Secondary | ICD-10-CM

## 2020-05-18 DIAGNOSIS — O30043 Twin pregnancy, dichorionic/diamniotic, third trimester: Secondary | ICD-10-CM | POA: Diagnosis present

## 2020-05-18 DIAGNOSIS — O289 Unspecified abnormal findings on antenatal screening of mother: Secondary | ICD-10-CM

## 2020-05-18 DIAGNOSIS — O30049 Twin pregnancy, dichorionic/diamniotic, unspecified trimester: Secondary | ICD-10-CM | POA: Diagnosis present

## 2020-05-18 DIAGNOSIS — O99213 Obesity complicating pregnancy, third trimester: Secondary | ICD-10-CM | POA: Diagnosis not present

## 2020-05-18 DIAGNOSIS — Z363 Encounter for antenatal screening for malformations: Secondary | ICD-10-CM

## 2020-05-18 DIAGNOSIS — Z3A35 35 weeks gestation of pregnancy: Secondary | ICD-10-CM

## 2020-05-24 ENCOUNTER — Encounter: Payer: Self-pay | Admitting: Obstetrics and Gynecology

## 2020-05-24 ENCOUNTER — Other Ambulatory Visit (HOSPITAL_COMMUNITY)
Admission: RE | Admit: 2020-05-24 | Discharge: 2020-05-24 | Disposition: A | Discharge status: Home / Self Care | Payer: BLUE CROSS/BLUE SHIELD | Source: Ambulatory Visit | Attending: Obstetrics and Gynecology | Admitting: Obstetrics and Gynecology

## 2020-05-24 ENCOUNTER — Ambulatory Visit (INDEPENDENT_AMBULATORY_CARE_PROVIDER_SITE_OTHER): Payer: Medicaid Other | Admitting: Obstetrics and Gynecology

## 2020-05-24 ENCOUNTER — Other Ambulatory Visit: Payer: Self-pay

## 2020-05-24 VITALS — BP 117/75 | HR 83 | Wt 206.4 lb

## 2020-05-24 DIAGNOSIS — O30043 Twin pregnancy, dichorionic/diamniotic, third trimester: Secondary | ICD-10-CM

## 2020-05-24 DIAGNOSIS — O0992 Supervision of high risk pregnancy, unspecified, second trimester: Secondary | ICD-10-CM

## 2020-05-24 NOTE — Progress Notes (Signed)
Subjective:  Kathy Harris is a 19 y.o. G2P0010 at [redacted]w[redacted]d being seen today for ongoing prenatal care.  She is currently monitored for the following issues for this high-risk pregnancy and has Twin pregnancy and Supervision of high risk pregnancy in second trimester on their problem list.  Patient reports general discomforts of pregnancy.  Contractions: Irregular. Vag. Bleeding: None.  Movement: Present. Denies leaking of fluid.   The following portions of the patient's history were reviewed and updated as appropriate: allergies, current medications, past family history, past medical history, past social history, past surgical history and problem list. Problem list updated.  Objective:   Vitals:   05/24/20 0934  BP: 117/75  Pulse: 83  Weight: 206 lb 6.4 oz (93.6 kg)    Fetal Status:     Movement: Present     General:  Alert, oriented and cooperative. Patient is in no acute distress.  Skin: Skin is warm and dry. No rash noted.   Cardiovascular: Normal heart rate noted  Respiratory: Normal respiratory effort, no problems with respiration noted  Abdomen: Soft, gravid, appropriate for gestational age. Pain/Pressure: Absent     Pelvic:  Cervical exam performed        Extremities: Normal range of motion.  Edema: Mild pitting, slight indentation  Mental Status: Normal mood and affect. Normal behavior. Normal judgment and thought content.   Urinalysis:      Assessment and Plan:  Pregnancy: G2P0010 at [redacted]w[redacted]d  1. Supervision of high risk pregnancy in second trimester Vaginal cultures   2. Dichorionic diamniotic twin pregnancy in third trimester Growth scan tomorrow Desires c section d/t malpresentation Scheduled for 38 weeks  3. GBS urine Tx while in labor  Term labor symptoms and general obstetric precautions including but not limited to vaginal bleeding, contractions, leaking of fluid and fetal movement were reviewed in detail with the patient. Please refer to After Visit Summary  for other counseling recommendations.  Return in about 1 week (around 05/31/2020) for OB visit, face to face, MD only.   Hermina Staggers, MD

## 2020-05-24 NOTE — Progress Notes (Signed)
Patient presents for ROB. Patient has no concerns today. 

## 2020-05-25 ENCOUNTER — Ambulatory Visit: Payer: Medicaid Other | Admitting: *Deleted

## 2020-05-25 ENCOUNTER — Encounter: Payer: Self-pay | Admitting: *Deleted

## 2020-05-25 ENCOUNTER — Ambulatory Visit: Payer: Medicaid Other | Attending: Obstetrics and Gynecology

## 2020-05-25 VITALS — BP 114/63 | HR 86

## 2020-05-25 DIAGNOSIS — Z362 Encounter for other antenatal screening follow-up: Secondary | ICD-10-CM

## 2020-05-25 DIAGNOSIS — O99213 Obesity complicating pregnancy, third trimester: Secondary | ICD-10-CM | POA: Diagnosis not present

## 2020-05-25 DIAGNOSIS — O321XX1 Maternal care for breech presentation, fetus 1: Secondary | ICD-10-CM | POA: Diagnosis not present

## 2020-05-25 DIAGNOSIS — Z3A36 36 weeks gestation of pregnancy: Secondary | ICD-10-CM

## 2020-05-25 DIAGNOSIS — O30049 Twin pregnancy, dichorionic/diamniotic, unspecified trimester: Secondary | ICD-10-CM | POA: Insufficient documentation

## 2020-05-25 DIAGNOSIS — O30043 Twin pregnancy, dichorionic/diamniotic, third trimester: Secondary | ICD-10-CM | POA: Diagnosis not present

## 2020-05-25 DIAGNOSIS — E669 Obesity, unspecified: Secondary | ICD-10-CM | POA: Diagnosis not present

## 2020-05-25 DIAGNOSIS — O322XX2 Maternal care for transverse and oblique lie, fetus 2: Secondary | ICD-10-CM | POA: Diagnosis not present

## 2020-05-25 LAB — CERVICOVAGINAL ANCILLARY ONLY
Bacterial Vaginitis (gardnerella): NEGATIVE
Chlamydia: NEGATIVE
Comment: NEGATIVE
Comment: NEGATIVE
Comment: NEGATIVE
Comment: NORMAL
Neisseria Gonorrhea: NEGATIVE
Trichomonas: NEGATIVE

## 2020-06-01 ENCOUNTER — Ambulatory Visit (INDEPENDENT_AMBULATORY_CARE_PROVIDER_SITE_OTHER): Payer: Self-pay | Admitting: Pediatrics

## 2020-06-01 ENCOUNTER — Other Ambulatory Visit: Payer: Self-pay | Admitting: *Deleted

## 2020-06-01 ENCOUNTER — Ambulatory Visit: Payer: Medicaid Other | Attending: Obstetrics and Gynecology | Admitting: *Deleted

## 2020-06-01 ENCOUNTER — Other Ambulatory Visit: Payer: Self-pay

## 2020-06-01 ENCOUNTER — Encounter (HOSPITAL_COMMUNITY): Payer: Self-pay | Admitting: Obstetrics & Gynecology

## 2020-06-01 ENCOUNTER — Inpatient Hospital Stay (HOSPITAL_COMMUNITY)
Admission: AD | Admit: 2020-06-01 | Discharge: 2020-06-02 | Disposition: A | Discharge status: Home / Self Care | Payer: Medicaid Other | Attending: Obstetrics & Gynecology | Admitting: Obstetrics & Gynecology

## 2020-06-01 ENCOUNTER — Ambulatory Visit: Payer: Medicaid Other | Admitting: *Deleted

## 2020-06-01 ENCOUNTER — Encounter: Payer: Self-pay | Admitting: *Deleted

## 2020-06-01 VITALS — BP 108/62 | HR 102

## 2020-06-01 DIAGNOSIS — Z7681 Expectant parent(s) prebirth pediatrician visit: Secondary | ICD-10-CM

## 2020-06-01 DIAGNOSIS — M79605 Pain in left leg: Secondary | ICD-10-CM

## 2020-06-01 DIAGNOSIS — O99891 Other specified diseases and conditions complicating pregnancy: Secondary | ICD-10-CM

## 2020-06-01 DIAGNOSIS — O471 False labor at or after 37 completed weeks of gestation: Secondary | ICD-10-CM | POA: Insufficient documentation

## 2020-06-01 DIAGNOSIS — O30043 Twin pregnancy, dichorionic/diamniotic, third trimester: Secondary | ICD-10-CM

## 2020-06-01 DIAGNOSIS — O30049 Twin pregnancy, dichorionic/diamniotic, unspecified trimester: Secondary | ICD-10-CM

## 2020-06-01 DIAGNOSIS — Z3A37 37 weeks gestation of pregnancy: Secondary | ICD-10-CM | POA: Diagnosis not present

## 2020-06-01 DIAGNOSIS — Z3689 Encounter for other specified antenatal screening: Secondary | ICD-10-CM | POA: Insufficient documentation

## 2020-06-01 NOTE — Discharge Instructions (Signed)

## 2020-06-01 NOTE — MAU Provider Note (Signed)
S: Ms. Marin Wisner is a 19 y.o. G2P0010 at [redacted]w[redacted]d  who presents to MAU today for labor evaluation.   Nurse reports no cervical change after one hour.   Cervical exam by RN:  Dilation: Fingertip Effacement (%): Thick Cervical Position: Posterior Station: Ballotable Presentation: Undeterminable Exam by:: Holly Flippin RN  Fetal Monitoring: Baby A: Baseline: 135 Variability: Moderate Accelerations: Present Decelerations: None  Baby B:  120 bpm, Mod Var, -Decels, +Accels   Contractions: Irregular  MDM Discussed patient with RN. NST reviewed.   A: SIUP at [redacted]w[redacted]d  False labor NST Reactive x 2  P: Discharge home Patient to follow-up with primary ob as scheduled  Patient may return to MAU as needed or when in labor   Gerrit Heck, [place] 06/01/2020 11:48 PM

## 2020-06-01 NOTE — Progress Notes (Signed)
Prenatal care started at [redacted] weeks gestation. C-section scheduled for 06/07/2020. Prenatal counseling for impending newborn done.

## 2020-06-01 NOTE — MAU Provider Note (Signed)
Event Date/Time   First Provider Initiated Contact with Patient 06/01/20 2247      S Ms. Kathy Harris is a 19 y.o. G2P0010 at [redacted]w[redacted]d patient who presents to MAU today with complaint of leg pain. Patient reports when she has contractions she gets pulling sensation in her left groin area. This has been going on ever since she started getting contractions in her pregnancy. Patient denies any abnormal sensations when she is not having any contractions and denies any redness, warmth, swelling or tenderness on palpation.   O BP 120/76 (BP Location: Right Arm)   Pulse 90   Temp 97.8 F (36.6 C) (Oral)   Resp 18   Ht 5' (1.524 m)   Wt 95.4 kg   LMP 09/08/2019   BMI 41.07 kg/m    Patient Vitals for the past 24 hrs:  BP Temp Temp src Pulse Resp Height Weight  06/01/20 2202 120/76 97.8 F (36.6 C) Oral 90 18 5' (1.524 m) 95.4 kg   Physical Exam Vitals and nursing note reviewed. Exam conducted with a chaperone present.  Constitutional:      General: She is not in acute distress.    Appearance: Normal appearance. She is not ill-appearing, toxic-appearing or diaphoretic.  HENT:     Head: Normocephalic and atraumatic.  Pulmonary:     Effort: Pulmonary effort is normal.  Musculoskeletal:     Comments: Left groin normal in appearance, no tenderness on palpation, no warmth, redness, swelling.  Skin:    General: Skin is warm and dry.  Neurological:     Mental Status: She is alert and oriented to person, place, and time.  Psychiatric:        Mood and Affect: Mood normal.        Behavior: Behavior normal.        Thought Content: Thought content normal.        Judgment: Judgment normal.    A Medical screening exam complete  P Discussed s/sx of blood clot in leg RN labor Eval  Marylen Ponto, NP 06/01/2020 10:54 PM

## 2020-06-01 NOTE — Procedures (Signed)
Amulya Quintin [birth year] [redacted]w[redacted]d  Fetus A Non-Stress Test Interpretation for 06/01/20  Indication: Di/Di twins  Fetal Heart Rate A Mode: External Baseline Rate (A): 145 bpm Variability: Moderate Accelerations: 15 x 15 Decelerations: None Multiple birth?: Yes  Uterine Activity Mode: Palpation,Toco Contraction Frequency (min): Irreg Contraction Duration (sec): 50-60 Contraction Quality: Mild Resting Tone Palpated: Relaxed Resting Time: Adequate  Interpretation (Fetal Testing) Nonstress Test Interpretation: Reactive Comments: Reviewed tracing with Dr. Grayling Congress [birth year] [redacted]w[redacted]d   Fetus B Non-Stress Test Interpretation for 06/01/20  Indication: Di/Di twins  Fetal Heart Rate Fetus B Mode: External,Doppler Baseline Rate (B): 130 BPM Variability: Moderate Accelerations: 15 x 15 Decelerations: None  Uterine Activity Mode: Palpation,Toco Contraction Frequency (min): Irreg Contraction Duration (sec): 50-60 Contraction Quality: Mild Resting Tone Palpated: Relaxed Resting Time: Adequate  Interpretation (Baby B - Fetal Testing) Nonstress Test Interpretation (Baby B): Reactive Comments (Baby B): Reviewed tracing with Dr. Parke Poisson

## 2020-06-01 NOTE — MAU Note (Signed)
Pt c/o ctx 6 min apart. Having them since this morning. Has been having L leg swelling today and feels like there's "a knot" in her leg upper left thigh. Twins, +FM, no bleeding or LOF.

## 2020-06-02 DIAGNOSIS — Z3689 Encounter for other specified antenatal screening: Secondary | ICD-10-CM | POA: Diagnosis not present

## 2020-06-02 DIAGNOSIS — O471 False labor at or after 37 completed weeks of gestation: Secondary | ICD-10-CM | POA: Diagnosis not present

## 2020-06-02 DIAGNOSIS — Z3A37 37 weeks gestation of pregnancy: Secondary | ICD-10-CM | POA: Diagnosis not present

## 2020-06-04 ENCOUNTER — Encounter (HOSPITAL_COMMUNITY): Payer: Self-pay

## 2020-06-04 ENCOUNTER — Telehealth (INDEPENDENT_AMBULATORY_CARE_PROVIDER_SITE_OTHER): Payer: BLUE CROSS/BLUE SHIELD | Admitting: Obstetrics and Gynecology

## 2020-06-04 ENCOUNTER — Encounter: Payer: Self-pay | Admitting: Obstetrics and Gynecology

## 2020-06-04 DIAGNOSIS — O0992 Supervision of high risk pregnancy, unspecified, second trimester: Secondary | ICD-10-CM

## 2020-06-04 DIAGNOSIS — Z3A37 37 weeks gestation of pregnancy: Secondary | ICD-10-CM

## 2020-06-04 DIAGNOSIS — O30043 Twin pregnancy, dichorionic/diamniotic, third trimester: Secondary | ICD-10-CM

## 2020-06-04 NOTE — Progress Notes (Signed)
OBSTETRICS PRENATAL VIRTUAL VISIT ENCOUNTER NOTE  Provider location: Center for [facility] [place]   I connected with Laurann Montana on 06/04/20 at  4:00 PM EST by MyChart Video Encounter at home and verified that I am speaking with the correct person using two identifiers.   I discussed the limitations, risks, security and privacy concerns of performing an evaluation and management service virtually and the availability of in person appointments. I also discussed with the patient that there may be a patient responsible charge related to this service. The patient expressed understanding and agreed to proceed. Subjective:  Kathy Harris is a 19 y.o. G2P0010 at [redacted]w[redacted]d being seen today for ongoing prenatal care.  She is currently monitored for the following issues for this high-risk pregnancy and has Twin pregnancy and Supervision of high risk pregnancy in second trimester on their problem list.  Patient reports occasional contractions.  Contractions: Irregular. Vag. Bleeding: None.  Movement: Present. Denies any leaking of fluid.   The following portions of the patient's history were reviewed and updated as appropriate: allergies, current medications, past family history, past medical history, past social history, past surgical history and problem list.   Objective:  There were no vitals filed for this visit.  Fetal Status:     Movement: Present     General:  Alert, oriented and cooperative. Patient is in no acute distress.  Respiratory: Normal respiratory effort, no problems with respiration noted  Mental Status: Normal mood and affect. Normal behavior. Normal judgment and thought content.  Rest of physical exam deferred due to type of encounter  Imaging: [place] MFM FETAL BPP WO NON STRESS  Result Date: 05/25/2020 ----------------------------------------------------------------------  OBSTETRICS REPORT                       (Signed Final 05/25/2020 12:49 pm)  ---------------------------------------------------------------------- Patient Info  ID #:       [id number]                          D.O.B.:  [birth year] (19 yrs)  Name:       Kathy Harris                 Visit Date: 05/25/2020 11:46 am ---------------------------------------------------------------------- Performed By  Attending:        Ma Rings MD         Ref. Address:     [address]                                                             [address] [phone]  [place]                                                             [address]  Performed By:     Tommie Raymond BS,       [facility]                                [facility]  Referred By:      [facility] ---------------------------------------------------------------------- Orders  #  Description                           Code        Ordered By  1  [place] MFM FETAL BPP WO NON               76819.01    YU FANG     STRESS  2  [place] MFM FETAL BPP WO NST               76819.1     YU FANG     ADDL GESTATION ----------------------------------------------------------------------  #  Order #                     Accession #                Episode #  1  [id number]                   [id number]                 [id number]  2  [id number]                   [id number]                 [id number] ---------------------------------------------------------------------- Indications  Twin pregnancy, di/di, third trimester         O30.043  [redacted] weeks gestation of pregnancy                Z3A.36  Encounter for other antenatal screening        Z36.2  follow-up  Abnormal biochemical screen ( NIPS)            O28.9  Obesity complicating pregnancy, third  Z61.09699.213  trimester  ---------------------------------------------------------------------- Fetal Evaluation (Fetus A)  Num Of Fetuses:         2  Fetal Heart Rate(bpm):  141  Cardiac Activity:       Observed  Fetal Lie:              Lower Fetus  Presentation:           Breech  Placenta:               Anterior Left  P. Cord Insertion:      Previously Visualized  Membrane Desc:      Dividing Membrane seen - Dichorionic.  Amniotic Fluid  AFI FV:      Within normal limits                              Largest Pocket(cm)                              5.8 ---------------------------------------------------------------------- Biophysical Evaluation (Fetus A)  Amniotic F.V:   Pocket => 2 cm             F. Tone:        Observed  F. Movement:    Observed                   Score:          8/8  F. Breathing:   Observed ---------------------------------------------------------------------- OB History  Gravidity:    2         Term:   0        Prem:   0        SAB:   1  TOP:          0       Ectopic:  0        Living: 0 ---------------------------------------------------------------------- Gestational Age (Fetus A)  LMP:           36w 1d        Date:  09/15/19                 EDD:   06/21/20  Best:          36w 1d     Det. By:  LMP  (09/15/19)          EDD:   06/21/20 ---------------------------------------------------------------------- Anatomy (Fetus A)  Ventricles:            Appears normal         Stomach:                Appears normal, left                                                                        sided  Heart:                 Appears normal         Kidneys:                Appear normal                         (  4CH, axis, and                         situs)  Diaphragm:             Appears normal         Bladder:                Appears normal  Other:  Technicallly difficult due to advanced GA, fetal position, and maternal          habitus. ---------------------------------------------------------------------- Fetal Evaluation (Fetus B)   Num Of Fetuses:         2  Fetal Heart Rate(bpm):  137  Cardiac Activity:       Observed  Fetal Lie:              Upper Fetus  Presentation:           Transverse, head to maternal left  Placenta:               Anterior  P. Cord Insertion:      Previously Visualized  Membrane Desc:      Dividing Membrane seen - Dichorionic.  Amniotic Fluid  AFI FV:      Within normal limits                              Largest Pocket(cm)                              7.6 ---------------------------------------------------------------------- Biophysical Evaluation (Fetus B)  Amniotic F.V:   Pocket => 2 cm             F. Tone:        Observed  F. Movement:    Observed                   Score:          8/8  F. Breathing:   Observed ---------------------------------------------------------------------- Gestational Age (Fetus B)  LMP:           36w 1d        Date:  09/15/19                 EDD:   06/21/20  Best:          36w 1d     Det. By:  LMP  (09/15/19)          EDD:   06/21/20 ---------------------------------------------------------------------- Anatomy (Fetus B)  Ventricles:            Appears normal         Stomach:                Appears normal, left                                                                        sided  Heart:                 Appears normal         Kidneys:  Appear normal                         (4CH, axis, and                         situs)  Diaphragm:             Appears normal         Bladder:                Appears normal  Other:  Technicallly difficult due to advanced GA, fetal position, and maternal          habitus. ---------------------------------------------------------------------- Cervix Uterus Adnexa  Cervix  Not visualized (advanced GA >24wks)  Uterus  No abnormality visualized.  Right Ovary  Within normal limits.  Left Ovary  Within normal limits.  Cul De Sac  No free fluid seen.  Adnexa  No abnormality visualized.  ---------------------------------------------------------------------- Comments  This patient was seen for a biophysical profile due to  dichorionic, diamniotic twins.  She denies any problems since  her last exam and reports feeling vigorous fetal movements  of both fetuses throughout the day.  A biophysical profile performed today was 8 out of 8 for both  twin A and twin B.  There was normal amniotic fluid noted on today's ultrasound  exam around twin A and twin B.  She will return in 1 week for another biophysical profile.  She has a cesarean delivery already scheduled on June 07, 2020. ----------------------------------------------------------------------                   Ma Rings, MD Electronically Signed Final Report   05/25/2020 12:49 pm ----------------------------------------------------------------------  [place] MFM OB FOLLOW UP  Result Date: 05/18/2020 ----------------------------------------------------------------------  OBSTETRICS REPORT                       (Signed Final 05/18/2020 12:18 pm) ---------------------------------------------------------------------- Patient Info  ID #:       [id number]                          D.O.B.:  [birth year] (19 yrs)  Name:       Kathy Harris                 Visit Date: 05/18/2020 11:58 am ---------------------------------------------------------------------- Performed By  Attending:        Noralee Space MD        Ref. Address:     [address]                                                             [phone]  [place]                                                             [address]  Performed By:     Marcellina Millin          [facility]                                     [facility]  Referred By:      [facility] ---------------------------------------------------------------------- Orders  #  Description                           Code        Ordered By  1  [place] MFM OB FOLLOW UP                   [phone]    YU FANG  2  [place] MFM OB FOLLOW UP ADDL              [id number]    YU FANG     GEST ----------------------------------------------------------------------  #  Order #                     Accession #                Episode #  1  [id number]                   [id number]                 [id number]  2  [id number]                   [id number]                 [id number] ---------------------------------------------------------------------- Indications  Twin pregnancy, di/di, third trimester         O30.043  [redacted] weeks gestation of pregnancy                Z3A.35  Antenatal screening for malformations (Neg     Z36.3  Horizon 14)  Abnormal biochemical screen ( NIPS)            O28.9  Obesity complicating pregnancy, third  Z61.096  trimester ---------------------------------------------------------------------- Fetal Evaluation (Fetus A)  Num Of Fetuses:         2  Fetal Heart Rate(bpm):  155  Cardiac Activity:       Observed  Fetal Lie:              Lower Fetus  Presentation:           Transverse, head to maternal right  Placenta:               Anterior Left  P. Cord Insertion:      Previously Visualized  Membrane Desc:      Dividing Membrane seen - Dichorionic.  Amniotic Fluid  AFI FV:      Within normal limits                              Largest Pocket(cm)                              3.5 ---------------------------------------------------------------------- Biometry (Fetus A)  BPD:      87.7  mm     G. Age:  35w 3d         62  %    CI:        78.18   %    70 - 86                                                          FL/HC:      21.2   %    20.1 - 22.3  HC:      313.8  mm     G. Age:  35w 1d         18  %    HC/AC:      1.01        0.93 - 1.11  AC:       310.5  mm     G. Age:  35w 0d         53  %    FL/BPD:     75.7   %    71 - 87  FL:       66.4  mm     G. Age:  34w 1d         20  %    FL/AC:      21.4   %    20 - 24  Est. FW:    2527  gm      5 lb 9 oz     38  %     FW Discordancy         8  % ---------------------------------------------------------------------- OB History  Gravidity:    2         Term:   0        Prem:   0        SAB:   1  TOP:          0       Ectopic:  0        Living: 0 ---------------------------------------------------------------------- Gestational Age (Fetus A)  LMP:           35w 1d  Date:  09/15/19                 EDD:   06/21/20  U/S Today:     35w 0d                                        EDD:   06/22/20  Best:          35w 1d     Det. By:  LMP  (09/15/19)          EDD:   06/21/20 ---------------------------------------------------------------------- Anatomy (Fetus A)  Cranium:               Appears normal         Aortic Arch:            Previously seen  Cavum:                 Previously seen        Ductal Arch:            Previously seen  Ventricles:            Appears normal         Diaphragm:              Appears normal  Choroid Plexus:        Previously seen        Stomach:                Appears normal, left                                                                        sided  Cerebellum:            Appears normal         Abdomen:                Previously seen  Posterior Fossa:       Previously seen        Abdominal Wall:         Previously seen  Nuchal Fold:           Previously seen        Cord Vessels:           Previously seen  Face:                  Orbits and profile     Kidneys:                Appear normal                         previously seen  Lips:                  Previously seen        Bladder:                Appears normal  Thoracic:              Previously seen        Spine:  Previously seen  Heart:                 Previously seen        Upper Extremities:      Previously seen   RVOT:                  Previously seen        Lower Extremities:      Previously seen  LVOT:                  Appears normal  Other:  Fetus appears to be a female. Right open hand/5th digit visualized          prev. Heels/feet visualized prev. Nasal bone visualized prev.          Technically difficult due to fetal position. ---------------------------------------------------------------------- Fetal Evaluation (Fetus B)  Num Of Fetuses:         2  Fetal Heart Rate(bpm):  145  Cardiac Activity:       Observed  Fetal Lie:              Upper Fetus  Presentation:           Transverse, head to maternal left  Placenta:               Anterior  P. Cord Insertion:      Previously Visualized  Membrane Desc:      Dividing Membrane seen - Dichorionic.  Amniotic Fluid  AFI FV:      Within normal limits                              Largest Pocket(cm)                              6.1 ---------------------------------------------------------------------- Biometry (Fetus B)  BPD:        90  mm     G. Age:  36w 3d         86  %    CI:        71.15   %    70 - 86                                                          FL/HC:      19.6   %    20.1 - 22.3  HC:      339.9  mm     G. Age:  39w 1d         96  %    HC/AC:      1.07        0.93 - 1.11  AC:      316.2  mm     G. Age:  35w 4d         69  %    FL/BPD:     74.0   %    71 - 87  FL:       66.6  mm     G. Age:  34w 2d         21  %    FL/AC:      21.1   %  20 - 24  Est. FW:    2738  gm      6 lb 1 oz     63  %     FW Discordancy      0 \ 8 % ---------------------------------------------------------------------- Gestational Age (Fetus B)  LMP:           35w 1d        Date:  09/15/19                 EDD:   06/21/20  U/S Today:     36w 3d                                        EDD:   06/12/20  Best:          35w 1d     Det. By:  LMP  (09/15/19)          EDD:   06/21/20 ---------------------------------------------------------------------- Anatomy (Fetus B)  Cranium:                Appears normal         Aortic Arch:            Previously seen  Cavum:                 Previously seen        Ductal Arch:            Previously seen  Ventricles:            Appears normal         Diaphragm:              Appears normal  Choroid Plexus:        Previously seen        Stomach:                Appears normal, left                                                                        sided  Cerebellum:            Appears normal         Abdomen:                Previously seen  Posterior Fossa:       Previously seen        Abdominal Wall:         Previously seen  Nuchal Fold:           Previously seen        Cord Vessels:           Previously seen  Face:                  Orbits and profile     Kidneys:                Appear normal                         previously seen  Lips:  Previously seen        Bladder:                Appears normal  Thoracic:              Previously seen        Spine:                  Previously seen  Heart:                 Appears normal         Upper Extremities:      Previously seen                         (4CH, axis, and                         situs)  RVOT:                  Appears normal         Lower Extremities:      Previously seen  LVOT:                  Previously seen  Other:  Fetus appears to be a female. Right hand/5th digit visualized prev.          Heels visualized prev. Nasal bone visualized prev. Technically difficult          due to fetal position. ---------------------------------------------------------------------- Cervix Uterus Adnexa  Cervix  Not visualized (advanced GA >24wks) ---------------------------------------------------------------------- Impression  Dichorionic-diamniotic twin pregnancy.  Patient return for fetal  growth assessment.  She does not have gestational diabetes.  Blood pressure today at our office is 109/60 mmHg.  Twin A: Lower fetus, transverse lie and head to maternal  right, anterior placenta, female fetus.  Fetal growth is   appropriate for gestational age.  Amniotic fluid is normal and  good fetal activity seen.  Twin B: Upper fetus, transverse lie and head to maternal left,  anterior placenta, female fetus.  Fetal growth is appropriate for  gestational age.  Amniotic fluid is normal and good fetal  activity seen.  Growth discordancy: 8% (normal).  We reassured the patient of the findings. ---------------------------------------------------------------------- Recommendations  -BPP or NST next week.  -BPP in 2 weeks.  -Delivery at [redacted] weeks gestation. ----------------------------------------------------------------------                  Noralee Space, MD Electronically Signed Final Report   05/18/2020 12:18 pm ----------------------------------------------------------------------  [place] MFM OB FOLLOW UP ADDL GEST  Result Date: 05/18/2020 ----------------------------------------------------------------------  OBSTETRICS REPORT                       (Signed Final 05/18/2020 12:18 pm) ---------------------------------------------------------------------- Patient Info  ID #:       [id number]                          D.O.B.:  04/14/2001 (19 yrs)  Name:       Kathy Harris                 Visit Date: 05/18/2020 11:58 am ---------------------------------------------------------------------- Performed By  Attending:        Noralee Space MD        Ref. Address:     [address]  [address]                                                             [address]                                                             [place]                                                             [address]  Performed By:     Marcellina Millin          [facility]                    RD[facility]                                     [facility]  Referred  By:      [facility] ---------------------------------------------------------------------- Orders  #  Description                           Code        Ordered By  1  [place] MFM OB FOLLOW UP                   [phone]    YU FANG  2  [place] MFM OB FOLLOW UP ADDL              81191.47    YU FANG     GEST ----------------------------------------------------------------------  #  Order #                     Accession #                Episode #  1  [id number]                   [id number]  [id number]  2  [id number]                   [id number]                 [id number] ---------------------------------------------------------------------- Indications  Twin pregnancy, di/di, third trimester         O30.043  [redacted] weeks gestation of pregnancy                Z3A.35  Antenatal screening for malformations (Neg     Z36.3  [facility] 14)  Abnormal biochemical screen ( NIPS)            O28.9  Obesity complicating pregnancy, third          O99.213  trimester ---------------------------------------------------------------------- Fetal Evaluation (Fetus A)  Num Of Fetuses:         2  Fetal Heart Rate(bpm):  155  Cardiac Activity:       Observed  Fetal Lie:              Lower Fetus  Presentation:           Transverse, head to maternal right  Placenta:               Anterior Left  P. Cord Insertion:      Previously Visualized  Membrane Desc:      Dividing Membrane seen - Dichorionic.  Amniotic Fluid  AFI FV:      Within normal limits                              Largest Pocket(cm)                              3.5 ---------------------------------------------------------------------- Biometry (Fetus A)  BPD:      87.7  mm     G. Age:  35w 3d         62  %    CI:        78.18   %    70 - 86                                                          FL/HC:      21.2   %    20.1 - 22.3  HC:      313.8  mm     G. Age:  35w 1d         18  %    HC/AC:      1.01        0.93 - 1.11  AC:      310.5  mm     G. Age:  35w 0d         53  %    FL/BPD:      75.7   %    71 - 87  FL:       66.4  mm     G. Age:  34w 1d         20  %    FL/AC:      21.4   %    20 - 24  Est. FW:    2527  gm      5 lb 9 oz     38  %     FW Discordancy         8  % ---------------------------------------------------------------------- OB History  Gravidity:    2         Term:   0        Prem:   0        SAB:   1  TOP:          0       Ectopic:  0        Living: 0 ---------------------------------------------------------------------- Gestational Age (Fetus A)  LMP:           35w 1d        Date:  09/15/19                 EDD:   06/21/20  U/S Today:     35w 0d                                        EDD:   06/22/20  Best:          35w 1d     Det. By:  LMP  (09/15/19)          EDD:   06/21/20 ---------------------------------------------------------------------- Anatomy (Fetus A)  Cranium:               Appears normal         Aortic Arch:            Previously seen  Cavum:                 Previously seen        Ductal Arch:            Previously seen  Ventricles:            Appears normal         Diaphragm:              Appears normal  Choroid Plexus:        Previously seen        Stomach:                Appears normal, left                                                                        sided  Cerebellum:            Appears normal         Abdomen:                Previously seen  Posterior Fossa:       Previously seen        Abdominal Wall:         Previously seen  Nuchal Fold:           Previously seen        Cord Vessels:           Previously seen  Face:  Orbits and profile     Kidneys:                Appear normal                         previously seen  Lips:                  Previously seen        Bladder:                Appears normal  Thoracic:              Previously seen        Spine:                  Previously seen  Heart:                 Previously seen        Upper Extremities:      Previously seen  RVOT:                  Previously seen        Lower  Extremities:      Previously seen  LVOT:                  Appears normal  Other:  Fetus appears to be a female. Right open hand/5th digit visualized          prev. Heels/feet visualized prev. Nasal bone visualized prev.          Technically difficult due to fetal position. ---------------------------------------------------------------------- Fetal Evaluation (Fetus B)  Num Of Fetuses:         2  Fetal Heart Rate(bpm):  145  Cardiac Activity:       Observed  Fetal Lie:              Upper Fetus  Presentation:           Transverse, head to maternal left  Placenta:               Anterior  P. Cord Insertion:      Previously Visualized  Membrane Desc:      Dividing Membrane seen - Dichorionic.  Amniotic Fluid  AFI FV:      Within normal limits                              Largest Pocket(cm)                              6.1 ---------------------------------------------------------------------- Biometry (Fetus B)  BPD:        90  mm     G. Age:  36w 3d         86  %    CI:        71.15   %    70 - 86                                                          FL/HC:      19.6   %    20.1 - 22.3  HC:  339.9  mm     G. Age:  39w 1d         96  %    HC/AC:      1.07        0.93 - 1.11  AC:      316.2  mm     G. Age:  35w 4d         69  %    FL/BPD:     74.0   %    71 - 87  FL:       66.6  mm     G. Age:  34w 2d         21  %    FL/AC:      21.1   %    20 - 24  Est. FW:    2738  gm      6 lb 1 oz     63  %     FW Discordancy      0 \ 8 % ---------------------------------------------------------------------- Gestational Age (Fetus B)  LMP:           35w 1d        Date:  09/15/19                 EDD:   06/21/20  U/S Today:     36w 3d                                        EDD:   06/12/20  Best:          35w 1d     Det. By:  LMP  (09/15/19)          EDD:   06/21/20 ---------------------------------------------------------------------- Anatomy (Fetus B)  Cranium:               Appears normal         Aortic Arch:             Previously seen  Cavum:                 Previously seen        Ductal Arch:            Previously seen  Ventricles:            Appears normal         Diaphragm:              Appears normal  Choroid Plexus:        Previously seen        Stomach:                Appears normal, left                                                                        sided  Cerebellum:            Appears normal         Abdomen:                Previously seen  Posterior Fossa:  Previously seen        Abdominal Wall:         Previously seen  Nuchal Fold:           Previously seen        Cord Vessels:           Previously seen  Face:                  Orbits and profile     Kidneys:                Appear normal                         previously seen  Lips:                  Previously seen        Bladder:                Appears normal  Thoracic:              Previously seen        Spine:                  Previously seen  Heart:                 Appears normal         Upper Extremities:      Previously seen                         (4CH, axis, and                         situs)  RVOT:                  Appears normal         Lower Extremities:      Previously seen  LVOT:                  Previously seen  Other:  Fetus appears to be a female. Right hand/5th digit visualized prev.          Heels visualized prev. Nasal bone visualized prev. Technically difficult          due to fetal position. ---------------------------------------------------------------------- Cervix Uterus Adnexa  Cervix  Not visualized (advanced GA >24wks) ---------------------------------------------------------------------- Impression  Dichorionic-diamniotic twin pregnancy.  Patient return for fetal  growth assessment.  She does not have gestational diabetes.  Blood pressure today at our office is 109/60 mmHg.  Twin A: Lower fetus, transverse lie and head to maternal  right, anterior placenta, female fetus.  Fetal growth is  appropriate for gestational age.  Amniotic  fluid is normal and  good fetal activity seen.  Twin B: Upper fetus, transverse lie and head to maternal left,  anterior placenta, female fetus.  Fetal growth is appropriate for  gestational age.  Amniotic fluid is normal and good fetal  activity seen.  Growth discordancy: 8% (normal).  We reassured the patient of the findings. ---------------------------------------------------------------------- Recommendations  -BPP or NST next week.  -BPP in 2 weeks.  -Delivery at [redacted] weeks gestation. ----------------------------------------------------------------------                  Noralee Space, MD Electronically Signed Final Report   05/18/2020 12:18 pm ----------------------------------------------------------------------  [place] MFM FETAL BPP WO NST ADDL GESTATION  Result Date: 05/25/2020 ----------------------------------------------------------------------  OBSTETRICS REPORT                       (Signed Final 05/25/2020 12:49 pm) ---------------------------------------------------------------------- Patient Info  ID #:       [id number]                          D.O.B.:  May 05, 2001 (19 yrs)  Name:       Kathy Harris                 Visit Date: 05/25/2020 11:46 am ---------------------------------------------------------------------- Performed By  Attending:        Ma Rings MD         Ref. Address:     [address]                                                             [address]                                                             [place]                                                             [address]  Performed By:     Tommie Raymond BS,       [facility]                    RD[facility], RVT                                [facility]  Referred By:  [facility]  ---------------------------------------------------------------------- Orders  #  Description                           Code        Ordered By  1  [place] MFM FETAL BPP WO NON               76819.01    YU FANG     STRESS  2  [place] MFM FETAL BPP WO NST               76819.1     YU FANG     ADDL GESTATION ----------------------------------------------------------------------  #  Order #                     Accession #                Episode #  1  [id number]                   [id number]                 [id number]  2  [id number]                   [id number]                 [id number] ---------------------------------------------------------------------- Indications  Twin pregnancy, di/di, third trimester         O30.043  [redacted] weeks gestation of pregnancy                Z3A.36  Encounter for other antenatal screening        Z36.2  follow-up  Abnormal biochemical screen ( NIPS)            O28.9  Obesity complicating pregnancy, third          O99.213  trimester ---------------------------------------------------------------------- Fetal Evaluation (Fetus A)  Num Of Fetuses:         2  Fetal Heart Rate(bpm):  141  Cardiac Activity:       Observed  Fetal Lie:              Lower Fetus  Presentation:           Breech  Placenta:               Anterior Left  P. Cord Insertion:      Previously Visualized  Membrane Desc:      Dividing Membrane seen - Dichorionic.  Amniotic Fluid  AFI FV:      Within normal limits                              Largest Pocket(cm)                              5.8 ---------------------------------------------------------------------- Biophysical Evaluation (Fetus A)  Amniotic F.V:   Pocket => 2 cm             F. Tone:        Observed  F. Movement:    Observed                   Score:          8/8  F. Breathing:   Observed ---------------------------------------------------------------------- OB History  Gravidity:    2  Term:   0        Prem:   0        SAB:   1  TOP:          0       Ectopic:  0         Living: 0 ---------------------------------------------------------------------- Gestational Age (Fetus A)  LMP:           36w 1d        Date:  09/15/19                 EDD:   06/21/20  Best:          36w 1d     Det. By:  LMP  (09/15/19)          EDD:   06/21/20 ---------------------------------------------------------------------- Anatomy (Fetus A)  Ventricles:            Appears normal         Stomach:                Appears normal, left                                                                        sided  Heart:                 Appears normal         Kidneys:                Appear normal                         (4CH, axis, and                         situs)  Diaphragm:             Appears normal         Bladder:                Appears normal  Other:  Technicallly difficult due to advanced GA, fetal position, and maternal          habitus. ---------------------------------------------------------------------- Fetal Evaluation (Fetus B)  Num Of Fetuses:         2  Fetal Heart Rate(bpm):  137  Cardiac Activity:       Observed  Fetal Lie:              Upper Fetus  Presentation:           Transverse, head to maternal left  Placenta:               Anterior  P. Cord Insertion:      Previously Visualized  Membrane Desc:      Dividing Membrane seen - Dichorionic.  Amniotic Fluid  AFI FV:      Within normal limits                              Largest Pocket(cm)  7.6 ---------------------------------------------------------------------- Biophysical Evaluation (Fetus B)  Amniotic F.V:   Pocket => 2 cm             F. Tone:        Observed  F. Movement:    Observed                   Score:          8/8  F. Breathing:   Observed ---------------------------------------------------------------------- Gestational Age (Fetus B)  LMP:           36w 1d        Date:  09/15/19                 EDD:   06/21/20  Best:          36w 1d     Det. By:  LMP  (09/15/19)          EDD:   06/21/20  ---------------------------------------------------------------------- Anatomy (Fetus B)  Ventricles:            Appears normal         Stomach:                Appears normal, left                                                                        sided  Heart:                 Appears normal         Kidneys:                Appear normal                         (4CH, axis, and                         situs)  Diaphragm:             Appears normal         Bladder:                Appears normal  Other:  Technicallly difficult due to advanced GA, fetal position, and maternal          habitus. ---------------------------------------------------------------------- Cervix Uterus Adnexa  Cervix  Not visualized (advanced GA >24wks)  Uterus  No abnormality visualized.  Right Ovary  Within normal limits.  Left Ovary  Within normal limits.  Cul De Sac  No free fluid seen.  Adnexa  No abnormality visualized. ---------------------------------------------------------------------- Comments  This patient was seen for a biophysical profile due to  dichorionic, diamniotic twins.  She denies any problems since  her last exam and reports feeling vigorous fetal movements  of both fetuses throughout the day.  A biophysical profile performed today was 8 out of 8 for both  twin A and twin B.  There was normal amniotic fluid noted on today's ultrasound  exam around twin A and twin B.  She will return in 1 week for another biophysical profile.  She has a cesarean delivery already scheduled on June 07, 2020. ----------------------------------------------------------------------  Ma Rings, MD Electronically Signed Final Report   05/25/2020 12:49 pm ----------------------------------------------------------------------   Assessment and Plan:  Pregnancy: G2P0010 at [redacted]w[redacted]d 1. Supervision of high risk pregnancy in second trimester Patient is doing well without complaints She reports good fetal movement x 2 Patient  does not have a BP cuff and was normotensive on 12/17 during labor evaluation in MAU Patient scheduled for primary cesarean section on 12/23 due to fetal malpresentation  2. Dichorionic diamniotic twin pregnancy in third trimester Concordant growth and BPP 8/8 x 2 on 12/10  Term labor symptoms and general obstetric precautions including but not limited to vaginal bleeding, contractions, leaking of fluid and fetal movement were reviewed in detail with the patient. I discussed the assessment and treatment plan with the patient. The patient was provided an opportunity to ask questions and all were answered. The patient agreed with the plan and demonstrated an understanding of the instructions. The patient was advised to call back or seek an in-person office evaluation/go to MAU at [facility] for any urgent or concerning symptoms. Please refer to After Visit Summary for other counseling recommendations.   I provided 15 minutes of face-to-face time during this encounter.  No follow-ups on file.  Future Appointments  Date Time Provider Department Center  06/04/2020  4:00 PM Jennavie Martinek, MD [facility] None  06/05/2020  9:15 AM [facility]-SCREENING [facility] None  06/05/2020 10:00 AM [facility]-LD PAT 1 [facility] None  06/07/2020  1:15 PM [facility] NST [facility]    Catalina Antigua, MD Center for [employer], [facility]

## 2020-06-04 NOTE — Patient Instructions (Signed)
Chaniyah Hawa Henly  06/04/2020   Your procedure is scheduled on:  12.23.2021  Arrive at 0800 at Entrance C on [employer] [address] at [facility]  and [employer]. You are invited to use the FREE valet parking or use the Visitor's parking deck.  Pick up the phone at the desk and dial [phone].  Call this number if you have problems the morning of surgery: [phone]  Remember:   Do not eat food:(After Midnight) Desps de medianoche.  Do not drink clear liquids: (After Midnight) Desps de medianoche.  Take these medicines the morning of surgery with A SIP OF WATER:  none   Do not wear jewelry, make-up or nail polish.  Do not wear lotions, powders, or perfumes. Do not wear deodorant.  Do not shave 48 hours prior to surgery.  Do not bring valuables to the hospital.  [facility] is not   responsible for any belongings or valuables brought to the hospital.  Contacts, dentures or bridgework may not be worn into surgery.  Leave suitcase in the car. After surgery it may be brought to your room.  For patients admitted to the hospital, checkout time is 11:00 AM the day of              discharge.      Please read over the following fact sheets that you were given:     Preparing for Surgery

## 2020-06-05 ENCOUNTER — Inpatient Hospital Stay (HOSPITAL_COMMUNITY): Payer: Medicaid Other | Admitting: Anesthesiology

## 2020-06-05 ENCOUNTER — Other Ambulatory Visit (HOSPITAL_COMMUNITY)
Admission: RE | Admit: 2020-06-05 | Discharge: 2020-06-05 | Disposition: A | Discharge status: Home / Self Care | Payer: Medicaid Other | Source: Ambulatory Visit | Attending: Family Medicine | Admitting: Family Medicine

## 2020-06-05 ENCOUNTER — Encounter (HOSPITAL_COMMUNITY): Payer: Self-pay | Admitting: Obstetrics and Gynecology

## 2020-06-05 ENCOUNTER — Encounter (HOSPITAL_COMMUNITY)
Admission: RE | Admit: 2020-06-05 | Discharge: 2020-06-05 | Disposition: A | Discharge status: Home / Self Care | Payer: Medicaid Other | Source: Ambulatory Visit | Attending: Family Medicine | Admitting: Family Medicine

## 2020-06-05 ENCOUNTER — Encounter (HOSPITAL_COMMUNITY)
Admission: AD | Disposition: A | Discharge status: Home / Self Care | Payer: Self-pay | Source: Home / Self Care | Attending: Family Medicine

## 2020-06-05 ENCOUNTER — Inpatient Hospital Stay (HOSPITAL_COMMUNITY)
Admission: AD | Admit: 2020-06-05 | Discharge: 2020-06-08 | DRG: 788 | Disposition: A | Discharge status: Home / Self Care | Payer: Medicaid Other | Attending: Family Medicine | Admitting: Family Medicine

## 2020-06-05 ENCOUNTER — Inpatient Hospital Stay (HOSPITAL_COMMUNITY): Payer: Medicaid Other

## 2020-06-05 ENCOUNTER — Other Ambulatory Visit: Payer: Self-pay

## 2020-06-05 DIAGNOSIS — Z3A37 37 weeks gestation of pregnancy: Secondary | ICD-10-CM

## 2020-06-05 DIAGNOSIS — M7989 Other specified soft tissue disorders: Secondary | ICD-10-CM

## 2020-06-05 DIAGNOSIS — Z20822 Contact with and (suspected) exposure to covid-19: Secondary | ICD-10-CM | POA: Diagnosis present

## 2020-06-05 DIAGNOSIS — O321XX2 Maternal care for breech presentation, fetus 2: Secondary | ICD-10-CM | POA: Diagnosis present

## 2020-06-05 DIAGNOSIS — O99891 Other specified diseases and conditions complicating pregnancy: Secondary | ICD-10-CM

## 2020-06-05 DIAGNOSIS — O322XX1 Maternal care for transverse and oblique lie, fetus 1: Secondary | ICD-10-CM | POA: Diagnosis present

## 2020-06-05 DIAGNOSIS — M79605 Pain in left leg: Secondary | ICD-10-CM | POA: Diagnosis present

## 2020-06-05 DIAGNOSIS — O30043 Twin pregnancy, dichorionic/diamniotic, third trimester: Secondary | ICD-10-CM | POA: Diagnosis present

## 2020-06-05 DIAGNOSIS — O99214 Obesity complicating childbirth: Secondary | ICD-10-CM | POA: Diagnosis present

## 2020-06-05 DIAGNOSIS — O0992 Supervision of high risk pregnancy, unspecified, second trimester: Secondary | ICD-10-CM

## 2020-06-05 DIAGNOSIS — Z01812 Encounter for preprocedural laboratory examination: Secondary | ICD-10-CM | POA: Insufficient documentation

## 2020-06-05 DIAGNOSIS — O368330 Maternal care for abnormalities of the fetal heart rate or rhythm, third trimester, not applicable or unspecified: Secondary | ICD-10-CM | POA: Diagnosis not present

## 2020-06-05 DIAGNOSIS — O368390 Maternal care for abnormalities of the fetal heart rate or rhythm, unspecified trimester, not applicable or unspecified: Secondary | ICD-10-CM

## 2020-06-05 DIAGNOSIS — R600 Localized edema: Secondary | ICD-10-CM | POA: Diagnosis not present

## 2020-06-05 DIAGNOSIS — O30009 Twin pregnancy, unspecified number of placenta and unspecified number of amniotic sacs, unspecified trimester: Secondary | ICD-10-CM | POA: Diagnosis present

## 2020-06-05 DIAGNOSIS — O99892 Other specified diseases and conditions complicating childbirth: Secondary | ICD-10-CM | POA: Diagnosis present

## 2020-06-05 DIAGNOSIS — O30033 Twin pregnancy, monochorionic/diamniotic, third trimester: Secondary | ICD-10-CM | POA: Diagnosis not present

## 2020-06-05 HISTORY — DX: Maternal care for abnormalities of the fetal heart rate or rhythm, unspecified trimester, not applicable or unspecified: O36.8390

## 2020-06-05 LAB — CBC
HCT: 35.6 % — ABNORMAL LOW (ref 36.0–46.0)
Hemoglobin: 12.6 g/dL (ref 12.0–15.0)
MCH: 31.7 pg (ref 26.0–34.0)
MCHC: 35.4 g/dL (ref 30.0–36.0)
MCV: 89.4 fL (ref 80.0–100.0)
Platelets: 270 10*3/uL (ref 150–400)
RBC: 3.98 MIL/uL (ref 3.87–5.11)
RDW: 12 % (ref 11.5–15.5)
WBC: 6 10*3/uL (ref 4.0–10.5)
nRBC: 0 % (ref 0.0–0.2)

## 2020-06-05 LAB — RESP PANEL BY RT-PCR (FLU A&B, COVID) ARPGX2
Influenza A by PCR: NEGATIVE
Influenza B by PCR: NEGATIVE
SARS Coronavirus 2 by RT PCR: NEGATIVE

## 2020-06-05 LAB — SARS CORONAVIRUS 2 (TAT 6-24 HRS): SARS Coronavirus 2: NEGATIVE

## 2020-06-05 LAB — RPR: RPR Ser Ql: NONREACTIVE

## 2020-06-05 LAB — TYPE AND SCREEN
ABO/RH(D): AB POS
Antibody Screen: NEGATIVE

## 2020-06-05 SURGERY — Surgical Case
Anesthesia: Spinal | Site: Abdomen | Wound class: Clean Contaminated

## 2020-06-05 MED ORDER — ONDANSETRON HCL 4 MG/2ML IJ SOLN
INTRAMUSCULAR | Status: AC
  Filled 2020-06-05: qty 2

## 2020-06-05 MED ORDER — NALBUPHINE HCL 10 MG/ML IJ SOLN: 5.0000 mg | Freq: Once | INTRAMUSCULAR | Status: DC | PRN

## 2020-06-05 MED ORDER — WITCH HAZEL-GLYCERIN EX PADS: 1.0000 "application " | MEDICATED_PAD | CUTANEOUS | Status: DC | PRN

## 2020-06-05 MED ORDER — LACTATED RINGERS IV BOLUS
1000.0000 mL | Freq: Once | INTRAVENOUS | Status: AC
  Administered 2020-06-05: 13:00:00 1000 mL via INTRAVENOUS

## 2020-06-05 MED ORDER — ONDANSETRON HCL 4 MG/2ML IJ SOLN
4.0000 mg | Freq: Three times a day (TID) | INTRAMUSCULAR | Status: DC | PRN
  Administered 2020-06-05: 21:00:00 4 mg via INTRAVENOUS
  Filled 2020-06-05: qty 2

## 2020-06-05 MED ORDER — OXYTOCIN-SODIUM CHLORIDE 30-0.9 UT/500ML-% IV SOLN
INTRAVENOUS | Status: DC | PRN
  Administered 2020-06-05: 300 mL via INTRAVENOUS

## 2020-06-05 MED ORDER — PROMETHAZINE HCL 25 MG/ML IJ SOLN: 6.2500 mg | INTRAMUSCULAR | Status: DC | PRN

## 2020-06-05 MED ORDER — NALBUPHINE HCL 10 MG/ML IJ SOLN: 5.0000 mg | INTRAMUSCULAR | Status: DC | PRN

## 2020-06-05 MED ORDER — TETANUS-DIPHTH-ACELL PERTUSSIS 5-2.5-18.5 LF-MCG/0.5 IM SUSY: 0.5000 mL | PREFILLED_SYRINGE | Freq: Once | INTRAMUSCULAR | Status: DC

## 2020-06-05 MED ORDER — PROMETHAZINE HCL 25 MG/ML IJ SOLN
INTRAMUSCULAR | Status: AC
  Filled 2020-06-05: qty 1

## 2020-06-05 MED ORDER — SOD CITRATE-CITRIC ACID 500-334 MG/5ML PO SOLN
30.0000 mL | Freq: Once | ORAL | Status: AC
  Administered 2020-06-05: 15:00:00 30 mL via ORAL
  Filled 2020-06-05: qty 15

## 2020-06-05 MED ORDER — SODIUM CHLORIDE 0.9% FLUSH: 3.0000 mL | INTRAVENOUS | Status: DC | PRN

## 2020-06-05 MED ORDER — FENTANYL CITRATE (PF) 100 MCG/2ML IJ SOLN: 25.0000 ug | INTRAMUSCULAR | Status: DC | PRN

## 2020-06-05 MED ORDER — PHENYLEPHRINE HCL-NACL 20-0.9 MG/250ML-% IV SOLN
INTRAVENOUS | Status: DC | PRN
  Administered 2020-06-05: 60 ug/min via INTRAVENOUS

## 2020-06-05 MED ORDER — OXYCODONE HCL 5 MG PO TABS: 5.0000 mg | ORAL_TABLET | ORAL | Status: DC | PRN

## 2020-06-05 MED ORDER — MENTHOL 3 MG MT LOZG: 1.0000 | LOZENGE | OROMUCOSAL | Status: DC | PRN

## 2020-06-05 MED ORDER — PHENYLEPHRINE 40 MCG/ML (10ML) SYRINGE FOR IV PUSH (FOR BLOOD PRESSURE SUPPORT)
PREFILLED_SYRINGE | INTRAVENOUS | Status: AC
  Filled 2020-06-05: qty 10

## 2020-06-05 MED ORDER — PRENATAL MULTIVITAMIN CH
1.0000 | ORAL_TABLET | Freq: Every day | ORAL | Status: DC
  Administered 2020-06-06 – 2020-06-08 (×3): 1 via ORAL
  Filled 2020-06-05 (×3): qty 1

## 2020-06-05 MED ORDER — MEASLES, MUMPS & RUBELLA VAC IJ SOLR: 0.5000 mL | Freq: Once | INTRAMUSCULAR | Status: DC

## 2020-06-05 MED ORDER — DIPHENHYDRAMINE HCL 25 MG PO CAPS: 25.0000 mg | ORAL_CAPSULE | ORAL | Status: DC | PRN

## 2020-06-05 MED ORDER — NALBUPHINE HCL 10 MG/ML IJ SOLN
5.0000 mg | Freq: Once | INTRAMUSCULAR | Status: DC | PRN
Start: 2020-06-05 — End: 2020-06-08

## 2020-06-05 MED ORDER — FENTANYL CITRATE (PF) 100 MCG/2ML IJ SOLN
INTRAMUSCULAR | Status: AC
  Filled 2020-06-05: qty 2

## 2020-06-05 MED ORDER — KETOROLAC TROMETHAMINE 30 MG/ML IJ SOLN
30.0000 mg | Freq: Four times a day (QID) | INTRAMUSCULAR | Status: AC
  Administered 2020-06-05 – 2020-06-06 (×3): 30 mg via INTRAVENOUS
  Filled 2020-06-05 (×3): qty 1

## 2020-06-05 MED ORDER — PHENYLEPHRINE HCL (PRESSORS) 10 MG/ML IV SOLN
INTRAVENOUS | Status: DC | PRN
  Administered 2020-06-05: 80 ug via INTRAVENOUS
  Administered 2020-06-05: 120 ug via INTRAVENOUS

## 2020-06-05 MED ORDER — MORPHINE SULFATE (PF) 0.5 MG/ML IJ SOLN
INTRAMUSCULAR | Status: DC | PRN
  Administered 2020-06-05: 150 ug via INTRATHECAL

## 2020-06-05 MED ORDER — OXYCODONE HCL 5 MG/5ML PO SOLN: 5.0000 mg | Freq: Once | ORAL | Status: DC | PRN

## 2020-06-05 MED ORDER — SODIUM CHLORIDE (PF) 0.9 % IJ SOLN
INTRAMUSCULAR | Status: AC
  Filled 2020-06-05: qty 10

## 2020-06-05 MED ORDER — ONDANSETRON HCL 4 MG/2ML IJ SOLN
INTRAMUSCULAR | Status: DC | PRN
  Administered 2020-06-05: 4 mg via INTRAVENOUS

## 2020-06-05 MED ORDER — KETOROLAC TROMETHAMINE 30 MG/ML IJ SOLN
30.0000 mg | Freq: Four times a day (QID) | INTRAMUSCULAR | Status: AC | PRN
  Administered 2020-06-05: 30 mg via INTRAVENOUS

## 2020-06-05 MED ORDER — OXYCODONE HCL 5 MG PO TABS
5.0000 mg | ORAL_TABLET | Freq: Once | ORAL | Status: DC | PRN
Start: 2020-06-05 — End: 2020-06-05

## 2020-06-05 MED ORDER — KETOROLAC TROMETHAMINE 30 MG/ML IJ SOLN
INTRAMUSCULAR | Status: AC
  Filled 2020-06-05: qty 1

## 2020-06-05 MED ORDER — KETOROLAC TROMETHAMINE 30 MG/ML IJ SOLN: 30.0000 mg | Freq: Once | INTRAMUSCULAR | Status: DC

## 2020-06-05 MED ORDER — MEPERIDINE HCL 25 MG/ML IJ SOLN: 6.2500 mg | INTRAMUSCULAR | Status: DC | PRN

## 2020-06-05 MED ORDER — OXYTOCIN-SODIUM CHLORIDE 30-0.9 UT/500ML-% IV SOLN: 2.5000 [IU]/h | INTRAVENOUS | Status: AC

## 2020-06-05 MED ORDER — METOCLOPRAMIDE HCL 5 MG/ML IJ SOLN
INTRAMUSCULAR | Status: AC
  Filled 2020-06-05: qty 2

## 2020-06-05 MED ORDER — ENOXAPARIN SODIUM 40 MG/0.4ML ~~LOC~~ SOLN
40.0000 mg | SUBCUTANEOUS | Status: DC
  Administered 2020-06-06 – 2020-06-07 (×2): 40 mg via SUBCUTANEOUS
  Filled 2020-06-05 (×3): qty 0.4

## 2020-06-05 MED ORDER — SIMETHICONE 80 MG PO CHEW
80.0000 mg | CHEWABLE_TABLET | Freq: Three times a day (TID) | ORAL | Status: DC
  Administered 2020-06-05 – 2020-06-08 (×6): 80 mg via ORAL
  Filled 2020-06-05 (×7): qty 1

## 2020-06-05 MED ORDER — SIMETHICONE 80 MG PO CHEW
80.0000 mg | CHEWABLE_TABLET | ORAL | Status: DC | PRN
  Administered 2020-06-07: 02:00:00 80 mg via ORAL
  Filled 2020-06-05: qty 1

## 2020-06-05 MED ORDER — PHENYLEPHRINE HCL-NACL 20-0.9 MG/250ML-% IV SOLN
INTRAVENOUS | Status: AC
  Filled 2020-06-05: qty 250

## 2020-06-05 MED ORDER — METOCLOPRAMIDE HCL 5 MG/ML IJ SOLN
INTRAMUSCULAR | Status: DC | PRN
  Administered 2020-06-05: 10 mg via INTRAVENOUS

## 2020-06-05 MED ORDER — SENNOSIDES-DOCUSATE SODIUM 8.6-50 MG PO TABS
2.0000 | ORAL_TABLET | ORAL | Status: DC
  Administered 2020-06-05 – 2020-06-08 (×4): 2 via ORAL
  Filled 2020-06-05 (×6): qty 2

## 2020-06-05 MED ORDER — SCOPOLAMINE 1 MG/3DAYS TD PT72
1.0000 | MEDICATED_PATCH | Freq: Once | TRANSDERMAL | Status: DC
  Administered 2020-06-05: 18:00:00 1.5 mg via TRANSDERMAL

## 2020-06-05 MED ORDER — KETOROLAC TROMETHAMINE 30 MG/ML IJ SOLN: 30.0000 mg | Freq: Four times a day (QID) | INTRAMUSCULAR | Status: AC | PRN

## 2020-06-05 MED ORDER — FAMOTIDINE IN NACL 20-0.9 MG/50ML-% IV SOLN
20.0000 mg | Freq: Once | INTRAVENOUS | Status: AC
  Administered 2020-06-05: 14:00:00 20 mg via INTRAVENOUS
  Filled 2020-06-05: qty 50

## 2020-06-05 MED ORDER — LACTATED RINGERS IV SOLN: INTRAVENOUS | Status: DC | PRN

## 2020-06-05 MED ORDER — PROMETHAZINE HCL 25 MG/ML IJ SOLN
INTRAMUSCULAR | Status: DC | PRN
  Administered 2020-06-05 (×2): 6.25 mg via INTRAVENOUS

## 2020-06-05 MED ORDER — MORPHINE SULFATE (PF) 0.5 MG/ML IJ SOLN
INTRAMUSCULAR | Status: AC
  Filled 2020-06-05: qty 10

## 2020-06-05 MED ORDER — DEXAMETHASONE SODIUM PHOSPHATE 4 MG/ML IJ SOLN
INTRAMUSCULAR | Status: DC | PRN
  Administered 2020-06-05: 8 mg via INTRAVENOUS

## 2020-06-05 MED ORDER — DIBUCAINE (PERIANAL) 1 % EX OINT
1.0000 | TOPICAL_OINTMENT | CUTANEOUS | Status: DC | PRN
Start: 2020-06-05 — End: 2020-06-08

## 2020-06-05 MED ORDER — FENTANYL CITRATE (PF) 100 MCG/2ML IJ SOLN
INTRAMUSCULAR | Status: DC | PRN
  Administered 2020-06-05: 15 ug via INTRATHECAL

## 2020-06-05 MED ORDER — DIPHENOXYLATE-ATROPINE 2.5-0.025 MG PO TABS
2.0000 | ORAL_TABLET | Freq: Four times a day (QID) | ORAL | Status: DC | PRN
  Administered 2020-06-05: 18:00:00 2 via ORAL
  Filled 2020-06-05: qty 2

## 2020-06-05 MED ORDER — DEXAMETHASONE SODIUM PHOSPHATE 4 MG/ML IJ SOLN
INTRAMUSCULAR | Status: AC
  Filled 2020-06-05: qty 2

## 2020-06-05 MED ORDER — COCONUT OIL OIL
1.0000 "application " | TOPICAL_OIL | Status: DC | PRN
  Administered 2020-06-07: 1 via TOPICAL

## 2020-06-05 MED ORDER — SCOPOLAMINE 1 MG/3DAYS TD PT72
MEDICATED_PATCH | TRANSDERMAL | Status: AC
  Filled 2020-06-05: qty 1

## 2020-06-05 MED ORDER — DIPHENHYDRAMINE HCL 25 MG PO CAPS: 25.0000 mg | ORAL_CAPSULE | Freq: Four times a day (QID) | ORAL | Status: DC | PRN

## 2020-06-05 MED ORDER — OXYTOCIN-SODIUM CHLORIDE 30-0.9 UT/500ML-% IV SOLN
INTRAVENOUS | Status: AC
  Filled 2020-06-05: qty 500

## 2020-06-05 MED ORDER — BUPIVACAINE IN DEXTROSE 0.75-8.25 % IT SOLN
INTRATHECAL | Status: DC | PRN
  Administered 2020-06-05: 1.6 mL via INTRATHECAL

## 2020-06-05 MED ORDER — CEFAZOLIN SODIUM-DEXTROSE 2-4 GM/100ML-% IV SOLN
2.0000 g | Freq: Once | INTRAVENOUS | Status: DC
  Filled 2020-06-05: qty 100

## 2020-06-05 MED ORDER — IBUPROFEN 800 MG PO TABS
800.0000 mg | ORAL_TABLET | Freq: Four times a day (QID) | ORAL | Status: DC
  Administered 2020-06-06 – 2020-06-08 (×6): 800 mg via ORAL
  Filled 2020-06-05 (×6): qty 1

## 2020-06-05 MED ORDER — CEFAZOLIN SODIUM-DEXTROSE 2-3 GM-%(50ML) IV SOLR
INTRAVENOUS | Status: DC | PRN
  Administered 2020-06-05: 2 g via INTRAVENOUS

## 2020-06-05 MED ORDER — LACTATED RINGERS IV SOLN: INTRAVENOUS | Status: DC

## 2020-06-05 MED ORDER — NALOXONE HCL 0.4 MG/ML IJ SOLN: 0.4000 mg | INTRAMUSCULAR | Status: DC | PRN

## 2020-06-05 MED ORDER — DIPHENHYDRAMINE HCL 50 MG/ML IJ SOLN: 12.5000 mg | INTRAMUSCULAR | Status: DC | PRN

## 2020-06-05 MED ORDER — NALOXONE HCL 4 MG/10ML IJ SOLN
1.0000 ug/kg/h | INTRAVENOUS | Status: DC | PRN
  Filled 2020-06-05: qty 5

## 2020-06-05 MED ORDER — ACETAMINOPHEN 325 MG PO TABS
650.0000 mg | ORAL_TABLET | ORAL | Status: DC | PRN
  Administered 2020-06-06 – 2020-06-07 (×2): 650 mg via ORAL
  Filled 2020-06-05 (×2): qty 2

## 2020-06-05 SURGICAL SUPPLY — 37 items
BENZOIN TINCTURE PRP APPL 2/3 (GAUZE/BANDAGES/DRESSINGS) ×3 IMPLANT
CHLORAPREP W/TINT 26ML (MISCELLANEOUS) ×3 IMPLANT
CLAMP CORD UMBIL (MISCELLANEOUS) IMPLANT
CLOSURE WOUND 1/2 X4 (GAUZE/BANDAGES/DRESSINGS) ×1
CLOTH BEACON ORANGE TIMEOUT ST (SAFETY) ×3 IMPLANT
DRSG OPSITE POSTOP 4X10 (GAUZE/BANDAGES/DRESSINGS) ×6 IMPLANT
DRSG OPSITE POSTOP 4X12 (GAUZE/BANDAGES/DRESSINGS) ×3 IMPLANT
ELECT REM PT RETURN 9FT ADLT (ELECTROSURGICAL) ×3
ELECTRODE REM PT RTRN 9FT ADLT (ELECTROSURGICAL) ×1 IMPLANT
EXTRACTOR VACUUM M CUP 4 TUBE (SUCTIONS) IMPLANT
EXTRACTOR VACUUM M CUP 4' TUBE (SUCTIONS)
GAUZE SPONGE 4X4 12PLY STRL LF (GAUZE/BANDAGES/DRESSINGS) ×6 IMPLANT
GLOVE BIOGEL PI IND STRL 7.0 (GLOVE) ×2 IMPLANT
GLOVE BIOGEL PI IND STRL 7.5 (GLOVE) ×2 IMPLANT
GLOVE BIOGEL PI INDICATOR 7.0 (GLOVE) ×4
GLOVE BIOGEL PI INDICATOR 7.5 (GLOVE) ×4
GLOVE ECLIPSE 7.5 STRL STRAW (GLOVE) ×3 IMPLANT
GOWN STRL REUS W/TWL LRG LVL3 (GOWN DISPOSABLE) ×9 IMPLANT
HOVERMATT SINGLE USE (MISCELLANEOUS) ×3 IMPLANT
KIT ABG SYR 3ML LUER SLIP (SYRINGE) ×3 IMPLANT
NEEDLE HYPO 25X5/8 SAFETYGLIDE (NEEDLE) IMPLANT
NS IRRIG 1000ML POUR BTL (IV SOLUTION) ×3 IMPLANT
PACK C SECTION WH (CUSTOM PROCEDURE TRAY) ×3 IMPLANT
PAD ABD 7.5X8 STRL (GAUZE/BANDAGES/DRESSINGS) ×3 IMPLANT
PAD ABD DERMACEA PRESS 5X9 (GAUZE/BANDAGES/DRESSINGS) ×3 IMPLANT
PAD OB MATERNITY 4.3X12.25 (PERSONAL CARE ITEMS) ×3 IMPLANT
PENCIL SMOKE EVAC W/HOLSTER (ELECTROSURGICAL) ×3 IMPLANT
RTRCTR C-SECT PINK 25CM LRG (MISCELLANEOUS) ×3 IMPLANT
STRIP CLOSURE SKIN 1/2X4 (GAUZE/BANDAGES/DRESSINGS) ×2 IMPLANT
SUT VIC AB 0 CTX 36 (SUTURE) ×6
SUT VIC AB 0 CTX36XBRD ANBCTRL (SUTURE) ×3 IMPLANT
SUT VIC AB 2-0 CT1 27 (SUTURE) ×2
SUT VIC AB 2-0 CT1 TAPERPNT 27 (SUTURE) ×1 IMPLANT
SUT VIC AB 4-0 KS 27 (SUTURE) ×3 IMPLANT
TOWEL OR 17X24 6PK STRL BLUE (TOWEL DISPOSABLE) ×3 IMPLANT
TRAY FOLEY W/BAG SLVR 14FR LF (SET/KITS/TRAYS/PACK) ×3 IMPLANT
WATER STERILE IRR 1000ML POUR (IV SOLUTION) ×3 IMPLANT

## 2020-06-05 NOTE — Discharge Instructions (Signed)

## 2020-06-05 NOTE — Op Note (Signed)
Kathy Harris PROCEDURE DATE: 06/05/2020  PREOPERATIVE DIAGNOSIS: Intrauterine pregnancy at  [redacted]w[redacted]d weeks gestation; malpresentation: twin A-transverse, Twin B-cephalic and non-reassuring fetal status  POSTOPERATIVE DIAGNOSIS: The same  PROCEDURE: stat primary Low Transverse Cesarean Section  SURGEON:  Dr. Candelaria Celeste  ASSISTANT: Dr. Mart Piggs  INDICATIONS: Kathy Harris is a 19 y.o. G2P0010 at [redacted]w[redacted]d scheduled for cesarean section secondary to malpresentation: twin A-transverse, twin B-breech and non-reassuring fetal status.  The risks of cesarean section discussed with the patient included but were not limited to: bleeding which may require transfusion or reoperation; infection which may require antibiotics; injury to bowel, bladder, ureters or other surrounding organs; injury to the fetus; need for additional procedures including hysterectomy in the event of a life-threatening hemorrhage; placental abnormalities wth subsequent pregnancies, incisional problems, thromboembolic phenomenon and other postoperative/anesthesia complications. The patient concurred with the proposed plan, giving informed written consent for the procedure.    FINDINGS:  Twin A: Viable female infant in transverse presentation.  Apgars 4 and 8, weight pending. Clear amniotic fluid. Twin B: Viable female infant in cephalic presentation. Apgars 2 and 8, weight pending.  Clear amniotic fluid.  Intact placenta, three vessel cord x2.  Normal uterus, fallopian tubes and ovaries bilaterally.   ANESTHESIA: Spinal INTRAVENOUS FLUIDS:2000 ml ESTIMATED BLOOD LOSS: 227 ml URINE OUTPUT:  100 ml SPECIMENS: Placenta sent to pathology COMPLICATIONS: None immediate  Skin incision: 1540 Delivery of Twin A: 1542  Arterial blood gas twin A- 7.11 Arterial blood gas twin B-7.05  PROCEDURE IN DETAIL:  The patient received intravenous antibiotics and had sequential compression devices applied to her lower extremities while in  the preoperative area.  She was then taken to the operating room where spinal anesthesia was administered and was found to be adequate. She was then placed in a dorsal supine position with a leftward tilt, and prepped with betadine in the setting of fetal bradycardia in a sterile manner. A foley catheter was placed into her bladder and attached to constant gravity, which drained clear fluid throughout.  A Pfannenstiel skin incision was made with scalpel and carried through to the underlying layer of fascia. The fascia was incised in the midline and this incision was extended bilaterally using the Mayo scissors and bluntly. Kocher clamps were applied to the superior aspect of the fascial incision and the underlying rectus muscles were dissected off bluntly. The rectus muscles were separated in the midline bluntly and the peritoneum was entered sharply with Metzenbaum scissors. Attention was turned to the lower uterine segment where a transverse hysterotomy was made with a scalpel and extended bilaterally bluntly. Twin A was successfully delivered after grasping left leg and delivering in breech presentation, and cord was clamped and cut immediately given status of Twin B, and infant was handed over to awaiting neonatology team.  Twin B was successfully delivered from cephalic presentation, and cord was clamped and cut immediately, and infant was handed over to awaiting neonatology team. Uterine massage was then administered and the placentas delivered intact with 2 three-vessel cords. The umbilical cord of Twin A was tagged with an umbilical clamp. The uterus was then cleared of clot and debris.  The hysterotomy was closed with 0 Vicryl in a running locked fashion and continued through to an imbricating layer. Overall, excellent hemostasis was noted. The abdomen and the pelvis were cleared of all clot and debris. Hemostasis was confirmed on all surfaces.  The peritoneum was reapproximated using 2-0 vicryl running  stitches. The fascia was then  closed using 0 Vicryl in a running fashion. The skin was closed with 4-0 vicryl. The patient tolerated the procedure well. Sponge, lap, instrument and needle counts were correct x 2. She was taken to the recovery room in stable condition.    Kathy Seton, MD 06/05/2020 4:23 PM

## 2020-06-05 NOTE — H&P (Signed)
Faculty Practice H&P  Anona Ebba Goll is a 19 y.o. female G2P0010 with IUP at [redacted]w[redacted]d presenting for evaluation in the MAU for swelling and pain in left leg. She was evaluated for DVT, which was negative. While in MAU, one of her baby had decelerations.  We will proceed with cesarean section for mal presentation and di/di twins.   Pt states she has been having irregular contractions, no vaginal bleeding, intact membranes, with normal fetal movement.     Prenatal Course Source of Care: [facility] with onset of care at 15 weeks  Pregnancy complications or risks: Patient Active Problem List   Diagnosis Date Noted  . Supervision of high risk pregnancy in second trimester 02/01/2020  . Twin pregnancy 01/04/2020   She desires condoms for contraception.  She plans to breastfeed  Prenatal labs and studies: ABO, Rh: --/--/AB POS (12/21 8413) Antibody: NEG (12/21 0942) Rubella: 2.02 (07/21 1411) RPR: NON REACTIVE (12/21 0941)  HBsAg: Negative (07/21 1411)  HIV: Non Reactive (10/15 0901)  GBS:    2hr Glucola: negative Genetic screening: abnormal with inconclusive NIPS Anatomy US: normal  Past Medical History:  Past Medical History:  Diagnosis Date  . Anemia    Phreesia 05/31/2020  . Medical history non-contributory     Past Surgical History:  Past Surgical History:  Procedure Laterality Date  . TONSILLECTOMY    . WISDOM TOOTH EXTRACTION      Obstetrical History:  OB History    Gravida  2   Para      Term      Preterm      AB  1   Living  0     SAB  1   IAB      Ectopic      Multiple      Live Births              Gynecological History:  OB History    Gravida  2   Para      Term      Preterm      AB  1   Living  0     SAB  1   IAB      Ectopic      Multiple      Live Births              Social History:  Social History   Socioeconomic History  . Marital status: Single    Spouse name: Not on file  . Number of children: Not  on file  . Years of education: Not on file  . Highest education level: Not on file  Occupational History  . Not on file  Tobacco Use  . Smoking status: Never Smoker  . Smokeless tobacco: Never Used  Vaping Use  . Vaping Use: Never used  Substance and Sexual Activity  . Alcohol use: Never  . Drug use: Never  . Sexual activity: Yes    Partners: Male    Comment: Pregnant   Other Topics Concern  . Not on file  Social History Narrative  . Not on file   Social Determinants of Health   Financial Resource Strain: Not on file  Food Insecurity: Not on file  Transportation Needs: Not on file  Physical Activity: Not on file  Stress: Not on file  Social Connections: Not on file    Family History:  Family History  Problem Relation Age of Onset  . Heart murmur Mother   . Diabetes  Father   . Hypertension Father     Medications:  Prenatal vitamins,  No current facility-administered medications for this encounter.    Allergies: No Known Allergies  Review of Systems: - negative  Physical Exam: Blood pressure 125/76, pulse 100, temperature 98 F (36.7 C), resp. rate 16, weight 96.2 kg, last menstrual period 09/08/2019, SpO2 99 %. GENERAL: Well-developed, well-nourished female in no acute distress.  LUNGS: Clear to auscultation bilaterally.  HEART: Regular rate and rhythm. ABDOMEN: Soft, nontender, nondistended, gravid.  EXTREMITIES: Nontender, no edema, 2+ distal pulses. Presentation: transverse, breech FHT:  Baseline rate 140/155 bpm   Variability moderate  Accelerations absent   Decelerations variable and prolonged Contractions: irregular   Pertinent Labs/Studies:   Lab Results  Component Value Date   WBC 6.0 06/05/2020   HGB 12.6 06/05/2020   HCT 35.6 (L) 06/05/2020   MCV 89.4 06/05/2020   PLT 270 06/05/2020    Assessment : Cathlyn Tinia Oravec is a 19 y.o. G2P0010 at [redacted]w[redacted]d being admitted for cesarean section secondary to mal position, decelerations,  twins  Plan: The risks of cesarean section discussed with the patient included but were not limited to: bleeding which may require transfusion or reoperation; infection which may require antibiotics; injury to bowel, bladder, ureters or other surrounding organs; injury to the fetus; need for additional procedures including hysterectomy in the event of a life-threatening hemorrhage; placental abnormalities wth subsequent pregnancies, incisional problems, thromboembolic phenomenon and other postoperative/anesthesia complications. The patient concurred with the proposed plan, giving informed written consent for the procedure.   Patient has been NPO since 945 and will remain NPO for procedure.  Preoperative prophylactic Ancef ordered on call to the OR.    Levie Heritage, DO 06/05/2020, 1:27 PM

## 2020-06-05 NOTE — MAU Note (Signed)
.   Kathy Harris is a 19 y.o. at [redacted]w[redacted]d here in MAU reporting: she has pain in her upper left leg that feels tight and feels like it has a knot in it. Denies any VB, LOF or contractions.   Onset of complaint: ongoing for weeks Pain score: 9 Vitals:   06/05/20 1002 06/05/20 1003  BP:  125/76  Pulse:  100  Resp:  16  Temp:  98 F (36.7 C)  SpO2: 99% 99%     FHT: Lab orders placed from triage: UA

## 2020-06-05 NOTE — Transfer of Care (Addendum)
Immediate Anesthesia Transfer of Care Note  Patient: Kathy Harris  Procedure(s) Performed: CESAREAN SECTION MULTI-GESTATIONAL (N/A Abdomen)  Patient Location: PACU  Anesthesia Type:Spinal  Level of Consciousness: awake, alert  and oriented  Airway & Oxygen Therapy: Patient Spontanous Breathing  Post-op Assessment: Report given to RN and Post -op Vital signs reviewed and stable  Post vital signs: Reviewed and stable  Last Vitals:  Vitals Value Taken Time  BP 112/60 06/05/20 1642  Temp    Pulse 74 06/05/20 1644  Resp 23 06/05/20 1644  SpO2 95 % 06/05/20 1644  Vitals shown include unvalidated device data.  Last Pain:  Vitals:   06/05/20 1002  PainSc: 9     Addendum at 1700: The patient's nausea has resolved. I remained in PACU for 15 minutes to assess the patient as the phenylephrine infusion was weaned off.      Complications: No complications documented.

## 2020-06-05 NOTE — Progress Notes (Signed)
Left lower extremity venous study completed.     Please see CV Proc for preliminary results.   Dareen Gutzwiller, RVT  

## 2020-06-05 NOTE — MAU Provider Note (Signed)
History     CSN: [id number]  Arrival date and time: 06/05/20 3976   Event Date/Time   First Provider Initiated Contact with Patient 06/05/20 1032      Chief Complaint  Patient presents with  . Leg Pain   Ms. Kathy Harris is a 19 y.o. G2P0010 at 37w5dwho presents to MAU for left leg pain. Patient was seen for this on Friday 06/01/2020 and at that time the leg pain was only occurring with contractions and physical exam was normal. Patient reports since early this morning the leg pain is constant, and she experienced a blue area on her inner left thigh that resolved after getting up and walking around. Patient also notes swelling of left thigh. Of note, patient had a virtual ROB visit yesterday with a physicians and did not discuss leg pain during that visit.  Pt denies VB, LOF, ctx, decreased FM, vaginal discharge/odor/itching. Pt denies N/V, abdominal pain, constipation, diarrhea, or urinary problems. Pt denies fever, chills, fatigue, sweating or changes in appetite. Pt denies SOB or chest pain. Pt denies dizziness, HA, light-headedness, weakness.  Problems this pregnancy include: Di-di twins, atypical result on Panorama, GBS in urine. Allergies? NKDA   OB History    Gravida  2   Para      Term      Preterm      AB  1   Living  0     SAB  1   IAB      Ectopic      Multiple      Live Births              Past Medical History:  Diagnosis Date  . Anemia    Phreesia 05/31/2020  . Medical history non-contributory     Past Surgical History:  Procedure Laterality Date  . TONSILLECTOMY    . WISDOM TOOTH EXTRACTION      Family History  Problem Relation Age of Onset  . Heart murmur Mother   . Diabetes Father   . Hypertension Father     Social History   Tobacco Use  . Smoking status: Never Smoker  . Smokeless tobacco: Never Used  Vaping Use  . Vaping Use: Never used  Substance Use Topics  . Alcohol use: Never  . Drug use: Never     Allergies: No Known Allergies  Medications Prior to Admission  Medication Sig Dispense Refill Last Dose  . aspirin 81 MG chewable tablet Chew 81 mg by mouth daily.     . Blood Pressure Monitor KIT 1 Device by Does not apply route once a week. To be monitored Regularly at home. (Patient not taking: Reported on 06/04/2020) 1 kit 0   . Elastic Bandages & Supports (COMFORT FIT MATERNITY SUPP SM) MISC Wear as directed. 1 each 0   . ferrous sulfate 325 (65 FE) MG tablet Take 1 tablet (325 mg total) by mouth 2 (two) times daily with a meal. 60 tablet 5   . folic acid (FOLVITE) 1 MG tablet Take 1 tablet (1 mg total) by mouth daily. 90 tablet 3   . Prenatal Vit-Fe Fumarate-FA (PRENATAL MULTIVITAMIN) TABS tablet Take 1 tablet by mouth 2 (two) times daily.       Review of Systems  Constitutional: Negative for chills, diaphoresis, fatigue and fever.  Eyes: Negative for visual disturbance.  Respiratory: Negative for shortness of breath.   Cardiovascular: Negative for chest pain.  Gastrointestinal: Negative for abdominal pain, constipation, diarrhea, nausea and  vomiting.  Genitourinary: Negative for dysuria, flank pain, frequency, pelvic pain, urgency, vaginal bleeding and vaginal discharge.  Musculoskeletal:       Left inner thigh pain and swelling  Neurological: Negative for dizziness, weakness, light-headedness and headaches.   Physical Exam   Blood pressure 125/76, pulse 100, temperature 98 F (36.7 C), resp. rate 16, weight 96.2 kg, last menstrual period 09/08/2019, SpO2 99 %.  Patient Vitals for the past 24 hrs:  BP Temp Pulse Resp SpO2 Weight  06/05/20 1003 125/76 98 F (36.7 C) 100 16 99 % 96.2 kg  06/05/20 1002 -- -- -- -- 99 % --   Physical Exam Vitals and nursing note reviewed.  Constitutional:      General: She is not in acute distress.    Appearance: Normal appearance. She is not ill-appearing, toxic-appearing or diaphoretic.  HENT:     Head: Normocephalic and atraumatic.   Pulmonary:     Effort: Pulmonary effort is normal.  Musculoskeletal:     Comments: Left thigh appears swollen compared to right. No redness or tenderness noted.  Skin:    General: Skin is warm and dry.  Neurological:     Mental Status: She is alert and oriented to person, place, and time.  Psychiatric:        Mood and Affect: Mood normal.        Behavior: Behavior normal.        Thought Content: Thought content normal.        Judgment: Judgment normal.    Results for orders placed or performed during the hospital encounter of 06/05/20 (from the past 24 hour(s))  Resp Panel by RT-PCR (Flu A&B, Covid) Nasopharyngeal Swab     Status: None   Collection Time: 06/05/20 11:56 AM   Specimen: Nasopharyngeal Swab; Nasopharyngeal(NP) swabs in vial transport medium  Result Value Ref Range   SARS Coronavirus 2 by RT PCR NEGATIVE NEGATIVE   Influenza A by PCR NEGATIVE NEGATIVE   Influenza B by PCR NEGATIVE NEGATIVE   [place] MFM FETAL BPP WO NON STRESS  Result Date: 05/25/2020 ----------------------------------------------------------------------  OBSTETRICS REPORT                       (Signed Final 05/25/2020 12:49 pm) ---------------------------------------------------------------------- Patient Info  ID #:       [id number]                          D.O.B.:  [birth year] (19 yrs)  Name:       Kathy Harris                 Visit Date: 05/25/2020 11:46 am ---------------------------------------------------------------------- Performed By  Attending:        Johnell Comings MD         Ref. Address:     [address]  [address]                                                             [address]  Performed By:     Jacob Moores BS,       [facility], RV[facility]                                 [facility] at                                                             [place] [facility]                                                             [facility]  Referred By:      [facility] ---------------------------------------------------------------------- Orders  #  Description                           Code        Ordered By  1  [place] MFM FETAL BPP WO NON               76819.01    YU FANG     STRESS  2  [place] MFM FETAL BPP WO NST               76819.1     YU FANG     ADDL GESTATION ----------------------------------------------------------------------  #  Order #                     Accession #                Episode #  1  [id number]                   [id number]                 [id number]  2  [id number]                   [id number]                 [id number] ---------------------------------------------------------------------- Indications  Twin pregnancy, di/di, third trimester         O30.043  [redacted] weeks gestation  of pregnancy                Z3A.36  Encounter for other antenatal screening        Z36.2  follow-up  Abnormal biochemical screen ( NIPS)            T11.7  Obesity complicating pregnancy, third          O99.213  trimester ---------------------------------------------------------------------- Fetal Evaluation (Fetus A)  Num Of Fetuses:         2  Fetal Heart Rate(bpm):  141  Cardiac Activity:       Observed  Fetal Lie:              Lower Fetus  Presentation:           Breech  Placenta:               Anterior Left  P. Cord Insertion:      Previously Visualized  Membrane Desc:      Dividing Membrane seen - Dichorionic.  Amniotic Fluid  AFI FV:      Within normal limits                              Largest Pocket(cm)                              5.8 ---------------------------------------------------------------------- Biophysical Evaluation (Fetus A)  Amniotic F.V:   Pocket => 2 cm             F. Tone:        Observed  F. Movement:    Observed                   Score:          8/8  F. Breathing:   Observed  ---------------------------------------------------------------------- OB History  Gravidity:    2         Term:   0        Prem:   0        SAB:   1  TOP:          0       Ectopic:  0        Living: 0 ---------------------------------------------------------------------- Gestational Age (Fetus A)  LMP:           36w 1d        Date:  09/15/19                 EDD:   06/21/20  Best:          36w 1d     Det. By:  LMP  (09/15/19)          EDD:   06/21/20 ---------------------------------------------------------------------- Anatomy (Fetus A)  Ventricles:            Appears normal         Stomach:                Appears normal, left  sided  Heart:                 Appears normal         Kidneys:                Appear normal                         (4CH, axis, and                         situs)  Diaphragm:             Appears normal         Bladder:                Appears normal  Other:  Technicallly difficult due to advanced GA, fetal position, and maternal          habitus. ---------------------------------------------------------------------- Fetal Evaluation (Fetus B)  Num Of Fetuses:         2  Fetal Heart Rate(bpm):  137  Cardiac Activity:       Observed  Fetal Lie:              Upper Fetus  Presentation:           Transverse, head to maternal left  Placenta:               Anterior  P. Cord Insertion:      Previously Visualized  Membrane Desc:      Dividing Membrane seen - Dichorionic.  Amniotic Fluid  AFI FV:      Within normal limits                              Largest Pocket(cm)                              7.6 ---------------------------------------------------------------------- Biophysical Evaluation (Fetus B)  Amniotic F.V:   Pocket => 2 cm             F. Tone:        Observed  F. Movement:    Observed                   Score:          8/8  F. Breathing:   Observed ----------------------------------------------------------------------  Gestational Age (Fetus B)  LMP:           36w 1d        Date:  09/15/19                 EDD:   06/21/20  Best:          36w 1d     Det. By:  LMP  (09/15/19)          EDD:   06/21/20 ---------------------------------------------------------------------- Anatomy (Fetus B)  Ventricles:            Appears normal         Stomach:                Appears normal, left  sided  Heart:                 Appears normal         Kidneys:                Appear normal                         (4CH, axis, and                         situs)  Diaphragm:             Appears normal         Bladder:                Appears normal  Other:  Technicallly difficult due to advanced GA, fetal position, and maternal          habitus. ---------------------------------------------------------------------- Cervix Uterus Adnexa  Cervix  Not visualized (advanced GA >24wks)  Uterus  No abnormality visualized.  Right Ovary  Within normal limits.  Left Ovary  Within normal limits.  Cul De Sac  No free fluid seen.  Adnexa  No abnormality visualized. ---------------------------------------------------------------------- Comments  This patient was seen for a biophysical profile due to  dichorionic, diamniotic twins.  She denies any problems since  her last exam and reports feeling vigorous fetal movements  of both fetuses throughout the day.  A biophysical profile performed today was 8 out of 8 for both  twin A and twin B.  There was normal amniotic fluid noted on today's ultrasound  exam around twin A and twin B.  She will return in 1 week for another biophysical profile.  She has a cesarean delivery already scheduled on June 07, 2020. ----------------------------------------------------------------------                   Johnell Comings, MD Electronically Signed Final Report   05/25/2020 12:49 pm ----------------------------------------------------------------------  VAS [place] LOWER  EXTREMITY VENOUS (DVT)  Result Date: 06/05/2020  Lower Venous DVT Study Indications: Swelling.  Performing Technologist: Vonzell Schlatter RVT  Examination Guidelines: A complete evaluation includes B-mode imaging, spectral Doppler, color Doppler, and power Doppler as needed of all accessible portions of each vessel. Bilateral testing is considered an integral part of a complete examination. Limited examinations for reoccurring indications may be performed as noted. The reflux portion of the exam is performed with the patient in reverse Trendelenburg.  +-----+---------------+---------+-----------+----------+--------------+ RIGHTCompressibilityPhasicitySpontaneityPropertiesThrombus Aging +-----+---------------+---------+-----------+----------+--------------+ CFV  Full           Yes      Yes                                 +-----+---------------+---------+-----------+----------+--------------+   +---------+---------------+---------+-----------+----------+--------------+ LEFT     CompressibilityPhasicitySpontaneityPropertiesThrombus Aging +---------+---------------+---------+-----------+----------+--------------+ CFV      Full           Yes      Yes                                 +---------+---------------+---------+-----------+----------+--------------+ SFJ      Full                                                        +---------+---------------+---------+-----------+----------+--------------+  FV Prox  Full                                                        +---------+---------------+---------+-----------+----------+--------------+ FV Mid   Full                                                        +---------+---------------+---------+-----------+----------+--------------+ FV DistalFull                                                        +---------+---------------+---------+-----------+----------+--------------+ PFV      Full                                                         +---------+---------------+---------+-----------+----------+--------------+ POP      Full           Yes      Yes                                 +---------+---------------+---------+-----------+----------+--------------+ PTV      Full                                                        +---------+---------------+---------+-----------+----------+--------------+ PERO     Full                                                        +---------+---------------+---------+-----------+----------+--------------+     Summary: RIGHT: - No evidence of deep vein thrombosis in the lower extremity. No indirect evidence of obstruction proximal to the inguinal ligament.  LEFT: - There is no evidence of deep vein thrombosis in the lower extremity.  - No cystic structure found in the popliteal fossa.  *See table(s) above for measurements and observations.    Preliminary    [place] MFM OB FOLLOW UP  Result Date: 05/18/2020 ----------------------------------------------------------------------  OBSTETRICS REPORT                       (Signed Final 05/18/2020 12:18 pm) ---------------------------------------------------------------------- Patient Info  ID #:       [id number]                          D.O.B.:  [birth year] (19 yrs)  Name:       Kathy Harris  Visit Date: 05/18/2020 11:58 am ---------------------------------------------------------------------- Performed By  Attending:        Tama High MD        Ref. Address:     [place] [place]                                                             [place]  Performed By:     Berlinda Last          Location[facility]         [facility]                    RDMS                                     [facility] at                                                              [place] for                                                             Women  Referred By:      [place] ---------------------------------------------------------------------- Orders  #  Description                           Code        Ordered By  1  [place] MFM OB FOLLOW UP                   76816.01    YU FANG  2  [place] MFM OB FOLLOW UP ADDL  58592.92    Peterson Ao     GEST ----------------------------------------------------------------------  #  Order #                     Accession #                Episode #  1  [id number]                   [id number]                 [id number]  2  [id number]                   [id number]                 [id number] ---------------------------------------------------------------------- Indications  Twin pregnancy, di/di, third trimester         O30.043  [redacted] weeks gestation of pregnancy                Z3A.35  Antenatal screening for malformations (Neg     Z36.3  [place] 14)  Abnormal biochemical screen ( NIPS)            D56.8  Obesity complicating pregnancy, third          O99.213  trimester ---------------------------------------------------------------------- Fetal Evaluation (Fetus A)  Num Of Fetuses:         2  Fetal Heart Rate(bpm):  155  Cardiac Activity:       Observed  Fetal Lie:              Lower Fetus  Presentation:           Transverse, head to maternal right  Placenta:               Anterior Left  P. Cord Insertion:      Previously Visualized  Membrane Desc:      Dividing Membrane seen - Dichorionic.  Amniotic Fluid  AFI FV:      Within normal limits                              Largest Pocket(cm)                              3.5 ---------------------------------------------------------------------- Biometry (Fetus A)  BPD:      87.7  mm     G. Age:  35w 3d         62  %    CI:        78.18   %    70 - 86                                                          FL/HC:      21.2   %    20.1 - 22.3  HC:      313.8   mm     G. Age:  35w 1d         18  %    HC/AC:      1.01        0.93 - 1.11  AC:      310.5  mm  G. Age:  35w 0d         56  %    FL/BPD:     75.7   %    71 - 87  FL:       66.4  mm     G. Age:  34w 1d         20  %    FL/AC:      21.4   %    20 - 24  Est. FW:    2527  gm      5 lb 9 oz     38  %     FW Discordancy         8  % ---------------------------------------------------------------------- OB History  Gravidity:    2         Term:   0        Prem:   0        SAB:   1  TOP:          0       Ectopic:  0        Living: 0 ---------------------------------------------------------------------- Gestational Age (Fetus A)  LMP:           35w 1d        Date:  09/15/19                 EDD:   06/21/20  U/S Today:     35w 0d                                        EDD:   06/22/20  Best:          35w 1d     Det. By:  LMP  (09/15/19)          EDD:   06/21/20 ---------------------------------------------------------------------- Anatomy (Fetus A)  Cranium:               Appears normal         Aortic Arch:            Previously seen  Cavum:                 Previously seen        Ductal Arch:            Previously seen  Ventricles:            Appears normal         Diaphragm:              Appears normal  Choroid Plexus:        Previously seen        Stomach:                Appears normal, left                                                                        sided  Cerebellum:            Appears normal         Abdomen:  Previously seen  Posterior Fossa:       Previously seen        Abdominal Wall:         Previously seen  Nuchal Fold:           Previously seen        Cord Vessels:           Previously seen  Face:                  Orbits and profile     Kidneys:                Appear normal                         previously seen  Lips:                  Previously seen        Bladder:                Appears normal  Thoracic:              Previously seen        Spine:                  Previously seen   Heart:                 Previously seen        Upper Extremities:      Previously seen  RVOT:                  Previously seen        Lower Extremities:      Previously seen  LVOT:                  Appears normal  Other:  Fetus appears to be a female. Right open hand/5th digit visualized          prev. Heels/feet visualized prev. Nasal bone visualized prev.          Technically difficult due to fetal position. ---------------------------------------------------------------------- Fetal Evaluation (Fetus B)  Num Of Fetuses:         2  Fetal Heart Rate(bpm):  145  Cardiac Activity:       Observed  Fetal Lie:              Upper Fetus  Presentation:           Transverse, head to maternal left  Placenta:               Anterior  P. Cord Insertion:      Previously Visualized  Membrane Desc:      Dividing Membrane seen - Dichorionic.  Amniotic Fluid  AFI FV:      Within normal limits                              Largest Pocket(cm)                              6.1 ---------------------------------------------------------------------- Biometry (Fetus B)  BPD:        90  mm     G. Age:  36w 3d         86  %    CI:  71.15   %    70 - 86                                                          FL/HC:      19.6   %    20.1 - 22.3  HC:      339.9  mm     G. Age:  39w 1d         96  %    HC/AC:      1.07        0.93 - 1.11  AC:      316.2  mm     G. Age:  35w 4d         69  %    FL/BPD:     74.0   %    71 - 87  FL:       66.6  mm     G. Age:  34w 2d         21  %    FL/AC:      21.1   %    20 - 24  Est. FW:    2738  gm      6 lb 1 oz     63  %     FW Discordancy      0 \ 8 % ---------------------------------------------------------------------- Gestational Age (Fetus B)  LMP:           35w 1d        Date:  09/15/19                 EDD:   06/21/20  U/S Today:     36w 3d                                        EDD:   06/12/20  Best:          35w 1d     Det. By:  LMP  (09/15/19)          EDD:   06/21/20  ---------------------------------------------------------------------- Anatomy (Fetus B)  Cranium:               Appears normal         Aortic Arch:            Previously seen  Cavum:                 Previously seen        Ductal Arch:            Previously seen  Ventricles:            Appears normal         Diaphragm:              Appears normal  Choroid Plexus:        Previously seen        Stomach:                Appears normal, left  sided  Cerebellum:            Appears normal         Abdomen:                Previously seen  Posterior Fossa:       Previously seen        Abdominal Wall:         Previously seen  Nuchal Fold:           Previously seen        Cord Vessels:           Previously seen  Face:                  Orbits and profile     Kidneys:                Appear normal                         previously seen  Lips:                  Previously seen        Bladder:                Appears normal  Thoracic:              Previously seen        Spine:                  Previously seen  Heart:                 Appears normal         Upper Extremities:      Previously seen                         (4CH, axis, and                         situs)  RVOT:                  Appears normal         Lower Extremities:      Previously seen  LVOT:                  Previously seen  Other:  Fetus appears to be a female. Right hand/5th digit visualized prev.          Heels visualized prev. Nasal bone visualized prev. Technically difficult          due to fetal position. ---------------------------------------------------------------------- Cervix Uterus Adnexa  Cervix  Not visualized (advanced GA >24wks) ---------------------------------------------------------------------- Impression  Dichorionic-diamniotic twin pregnancy.  Patient return for fetal  growth assessment.  She does not have gestational diabetes.  Blood pressure today at our office is 109/60 mmHg.   Twin A: Lower fetus, transverse lie and head to maternal  right, anterior placenta, female fetus.  Fetal growth is  appropriate for gestational age.  Amniotic fluid is normal and  good fetal activity seen.  Twin B: Upper fetus, transverse lie and head to maternal left,  anterior placenta, female fetus.  Fetal growth is appropriate for  gestational age.  Amniotic fluid is normal and good fetal  activity seen.  Growth discordancy: 8% (normal).  We reassured the patient of the findings. ---------------------------------------------------------------------- Recommendations  -BPP or NST next week.  -  BPP in 2 weeks.  -Delivery at [redacted] weeks gestation. ----------------------------------------------------------------------                  Tama High, MD Electronically Signed Final Report   05/18/2020 12:18 pm ----------------------------------------------------------------------  [place] MFM OB FOLLOW UP ADDL GEST  Result Date: 05/18/2020 ----------------------------------------------------------------------  OBSTETRICS REPORT                       (Signed Final 05/18/2020 12:18 pm) ---------------------------------------------------------------------- Patient Info  ID #:       [id number]                          D.O.B.:  [birth year] (19 yrs)  Name:       Kathy Harris                 Visit Date: 05/18/2020 11:58 am ---------------------------------------------------------------------- Performed By  Attending:        Tama High MD        Ref. Address:     [address] [place]                                                             [place]  Performed By:     Berlinda Last          Location:         [facility]                                     [facility]                                                              [facility]  Referred By:      [place] ---------------------------------------------------------------------- Orders  #  Description                           Code        Ordered By  1  [place] MFM OB FOLLOW UP                   [id number]    YU FANG  2  [place] MFM OB FOLLOW UP ADDL              [id number]    YU FANG     GEST ----------------------------------------------------------------------  #  Order #                     Accession #                Episode #  1  [id number]                   [id number]                 [id number]  2  [id number]                   [id number]                 [id number] ---------------------------------------------------------------------- Indications  Twin pregnancy, di/di, third trimester         O30.043  [redacted] weeks gestation of pregnancy                Z3A.35  Antenatal screening for malformations (Neg     Z36.3  [facility] 14)  Abnormal biochemical screen ( NIPS)            F35.4  Obesity complicating pregnancy, third          O99.213  trimester ---------------------------------------------------------------------- Fetal Evaluation (Fetus A)  Num Of Fetuses:         2  Fetal Heart Rate(bpm):  155  Cardiac Activity:       Observed  Fetal Lie:              Lower Fetus  Presentation:           Transverse, head to maternal right  Placenta:               Anterior Left  P. Cord Insertion:      Previously Visualized  Membrane Desc:      Dividing Membrane seen - Dichorionic.  Amniotic Fluid  AFI FV:      Within normal limits                              Largest Pocket(cm)                              3.5 ---------------------------------------------------------------------- Biometry (Fetus A)  BPD:      87.7  mm     G. Age:  35w 3d         62  %    CI:        78.18   %    70 - 86  FL/HC:      21.2   %    20.1 - 22.3  HC:      313.8  mm     G. Age:  35w 1d          18  %    HC/AC:      1.01        0.93 - 1.11  AC:      310.5  mm     G. Age:  35w 0d         48  %    FL/BPD:     75.7   %    71 - 87  FL:       66.4  mm     G. Age:  34w 1d         20  %    FL/AC:      21.4   %    20 - 24  Est. FW:    2527  gm      5 lb 9 oz     38  %     FW Discordancy         8  % ---------------------------------------------------------------------- OB History  Gravidity:    2         Term:   0        Prem:   0        SAB:   1  TOP:          0       Ectopic:  0        Living: 0 ---------------------------------------------------------------------- Gestational Age (Fetus A)  LMP:           35w 1d        Date:  09/15/19                 EDD:   06/21/20  U/S Today:     35w 0d                                        EDD:   06/22/20  Best:          35w 1d     Det. By:  LMP  (09/15/19)          EDD:   06/21/20 ---------------------------------------------------------------------- Anatomy (Fetus A)  Cranium:               Appears normal         Aortic Arch:            Previously seen  Cavum:                 Previously seen        Ductal Arch:            Previously seen  Ventricles:            Appears normal         Diaphragm:              Appears normal  Choroid Plexus:        Previously seen        Stomach:                Appears normal, left  sided  Cerebellum:            Appears normal         Abdomen:                Previously seen  Posterior Fossa:       Previously seen        Abdominal Wall:         Previously seen  Nuchal Fold:           Previously seen        Cord Vessels:           Previously seen  Face:                  Orbits and profile     Kidneys:                Appear normal                         previously seen  Lips:                  Previously seen        Bladder:                Appears normal  Thoracic:              Previously seen        Spine:                  Previously seen  Heart:                 Previously  seen        Upper Extremities:      Previously seen  RVOT:                  Previously seen        Lower Extremities:      Previously seen  LVOT:                  Appears normal  Other:  Fetus appears to be a female. Right open hand/5th digit visualized          prev. Heels/feet visualized prev. Nasal bone visualized prev.          Technically difficult due to fetal position. ---------------------------------------------------------------------- Fetal Evaluation (Fetus B)  Num Of Fetuses:         2  Fetal Heart Rate(bpm):  145  Cardiac Activity:       Observed  Fetal Lie:              Upper Fetus  Presentation:           Transverse, head to maternal left  Placenta:               Anterior  P. Cord Insertion:      Previously Visualized  Membrane Desc:      Dividing Membrane seen - Dichorionic.  Amniotic Fluid  AFI FV:      Within normal limits                              Largest Pocket(cm)                              6.1 ---------------------------------------------------------------------- Biometry (Fetus B)  BPD:  90  mm     G. Age:  36w 3d         86  %    CI:        71.15   %    70 - 86                                                          FL/HC:      19.6   %    20.1 - 22.3  HC:      339.9  mm     G. Age:  39w 1d         96  %    HC/AC:      1.07        0.93 - 1.11  AC:      316.2  mm     G. Age:  35w 4d         69  %    FL/BPD:     74.0   %    71 - 87  FL:       66.6  mm     G. Age:  34w 2d         21  %    FL/AC:      21.1   %    20 - 24  Est. FW:    2738  gm      6 lb 1 oz     63  %     FW Discordancy      0 \ 8 % ---------------------------------------------------------------------- Gestational Age (Fetus B)  LMP:           35w 1d        Date:  09/15/19                 EDD:   06/21/20  U/S Today:     36w 3d                                        EDD:   06/12/20  Best:          35w 1d     Det. By:  LMP  (09/15/19)          EDD:   06/21/20  ---------------------------------------------------------------------- Anatomy (Fetus B)  Cranium:               Appears normal         Aortic Arch:            Previously seen  Cavum:                 Previously seen        Ductal Arch:            Previously seen  Ventricles:            Appears normal         Diaphragm:              Appears normal  Choroid Plexus:        Previously seen        Stomach:  Appears normal, left                                                                        sided  Cerebellum:            Appears normal         Abdomen:                Previously seen  Posterior Fossa:       Previously seen        Abdominal Wall:         Previously seen  Nuchal Fold:           Previously seen        Cord Vessels:           Previously seen  Face:                  Orbits and profile     Kidneys:                Appear normal                         previously seen  Lips:                  Previously seen        Bladder:                Appears normal  Thoracic:              Previously seen        Spine:                  Previously seen  Heart:                 Appears normal         Upper Extremities:      Previously seen                         (4CH, axis, and                         situs)  RVOT:                  Appears normal         Lower Extremities:      Previously seen  LVOT:                  Previously seen  Other:  Fetus appears to be a female. Right hand/5th digit visualized prev.          Heels visualized prev. Nasal bone visualized prev. Technically difficult          due to fetal position. ---------------------------------------------------------------------- Cervix Uterus Adnexa  Cervix  Not visualized (advanced GA >24wks) ---------------------------------------------------------------------- Impression  Dichorionic-diamniotic twin pregnancy.  Patient return for fetal  growth assessment.  She does not have gestational diabetes.  Blood pressure today at our office is 109/60 mmHg.   Twin A: Lower fetus, transverse lie and head to maternal  right, anterior placenta, female fetus.  Fetal growth is  appropriate for gestational age.  Amniotic fluid is normal and  good fetal activity seen.  Twin B: Upper fetus, transverse lie and head to maternal left,  anterior placenta, female fetus.  Fetal growth is appropriate for  gestational age.  Amniotic fluid is normal and good fetal  activity seen.  Growth discordancy: 8% (normal).  We reassured the patient of the findings. ---------------------------------------------------------------------- Recommendations  -BPP or NST next week.  -BPP in 2 weeks.  -Delivery at [redacted] weeks gestation. ----------------------------------------------------------------------                  Tama High, MD Electronically Signed Final Report   05/18/2020 12:18 pm ----------------------------------------------------------------------  [place] MFM FETAL BPP WO NST ADDL GESTATION  Result Date: 05/25/2020 ----------------------------------------------------------------------  OBSTETRICS REPORT                       (Signed Final 05/25/2020 12:49 pm) ---------------------------------------------------------------------- Patient Info  ID #:       [id number]                          D.O.B.:  [birth year] (19 yrs)  Name:       Kathy Harris                 Visit Date: 05/25/2020 11:46 am ---------------------------------------------------------------------- Performed By  Attending:        Johnell Comings MD         Ref. Address:     [place]                                                             [place]  Performed By:     Jacob Moores BS,       Location[facility]         [facility]                    [facility], [facility]                                [facility]  Referred By:      [facility] ---------------------------------------------------------------------- Orders  #  Description                           Code        Ordered By  1  [place] MFM FETAL BPP WO NON               76819.01    YU FANG     STRESS  2  [place] MFM FETAL BPP WO NST               76819.1     YU FANG     ADDL GESTATION ----------------------------------------------------------------------  #  Order #                     Accession #                Episode #  1  [id number]                   [id number]                 [id number]  2  [id number]                   [id number]                 [id number] ---------------------------------------------------------------------- Indications  Twin pregnancy, di/di, third trimester         O30.043  [redacted] weeks gestation of pregnancy                Z3A.36  Encounter for other antenatal screening        Z36.2  follow-up  Abnormal biochemical screen ( NIPS)            B20.1  Obesity complicating pregnancy, third          O99.213  trimester ---------------------------------------------------------------------- Fetal Evaluation (Fetus A)  Num Of Fetuses:         2  Fetal Heart Rate(bpm):  141  Cardiac Activity:       Observed  Fetal Lie:              Lower Fetus  Presentation:           Breech  Placenta:               Anterior Left  P. Cord Insertion:      Previously Visualized  Membrane Desc:      Dividing Membrane seen - Dichorionic.  Amniotic Fluid  AFI FV:      Within normal limits                              Largest Pocket(cm)                              5.8 ---------------------------------------------------------------------- Biophysical Evaluation (Fetus A)  Amniotic F.V:   Pocket => 2 cm  F. Tone:        Observed  F. Movement:    Observed                   Score:          8/8  F. Breathing:   Observed  ---------------------------------------------------------------------- OB History  Gravidity:    2         Term:   0        Prem:   0        SAB:   1  TOP:          0       Ectopic:  0        Living: 0 ---------------------------------------------------------------------- Gestational Age (Fetus A)  LMP:           36w 1d        Date:  09/15/19                 EDD:   06/21/20  Best:          36w 1d     Det. By:  LMP  (09/15/19)          EDD:   06/21/20 ---------------------------------------------------------------------- Anatomy (Fetus A)  Ventricles:            Appears normal         Stomach:                Appears normal, left                                                                        sided  Heart:                 Appears normal         Kidneys:                Appear normal                         (4CH, axis, and                         situs)  Diaphragm:             Appears normal         Bladder:                Appears normal  Other:  Technicallly difficult due to advanced GA, fetal position, and maternal          habitus. ---------------------------------------------------------------------- Fetal Evaluation (Fetus B)  Num Of Fetuses:         2  Fetal Heart Rate(bpm):  137  Cardiac Activity:       Observed  Fetal Lie:              Upper Fetus  Presentation:           Transverse, head to maternal left  Placenta:               Anterior  P. Cord Insertion:      Previously Visualized  Membrane Desc:      Dividing Membrane seen -  Dichorionic.  Amniotic Fluid  AFI FV:      Within normal limits                              Largest Pocket(cm)                              7.6 ---------------------------------------------------------------------- Biophysical Evaluation (Fetus B)  Amniotic F.V:   Pocket => 2 cm             F. Tone:        Observed  F. Movement:    Observed                   Score:          8/8  F. Breathing:   Observed ----------------------------------------------------------------------  Gestational Age (Fetus B)  LMP:           36w 1d        Date:  09/15/19                 EDD:   06/21/20  Best:          36w 1d     Det. By:  LMP  (09/15/19)          EDD:   06/21/20 ---------------------------------------------------------------------- Anatomy (Fetus B)  Ventricles:            Appears normal         Stomach:                Appears normal, left                                                                        sided  Heart:                 Appears normal         Kidneys:                Appear normal                         (4CH, axis, and                         situs)  Diaphragm:             Appears normal         Bladder:                Appears normal  Other:  Technicallly difficult due to advanced GA, fetal position, and maternal          habitus. ---------------------------------------------------------------------- Cervix Uterus Adnexa  Cervix  Not visualized (advanced GA >24wks)  Uterus  No abnormality visualized.  Right Ovary  Within normal limits.  Left Ovary  Within normal limits.  Cul De Sac  No free fluid seen.  Adnexa  No abnormality visualized. ---------------------------------------------------------------------- Comments  This patient was seen for a biophysical profile due to  dichorionic, diamniotic twins.  She denies any problems since  her last exam and reports feeling vigorous fetal  movements  of both fetuses throughout the day.  A biophysical profile performed today was 8 out of 8 for both  twin A and twin B.  There was normal amniotic fluid noted on today's ultrasound  exam around twin A and twin B.  She will return in 1 week for another biophysical profile.  She has a cesarean delivery already scheduled on June 07, 2020. ----------------------------------------------------------------------                   Johnell Comings, MD Electronically Signed Final Report   05/25/2020 12:49 pm ----------------------------------------------------------------------   MAU Course   Procedures  MDM -r/o DVT with left thigh swelling -DVT US: WNL -Di-Di Twins, breech x2, C/S scheduled 06/07/2020 -EFM (Twin A): reactive       -baseline: 140       -variability: moderate       -accels: present, 15x15       -decels: absent       -TOCO: irregular ctx -EFM (Twin B): Cat II       -baseline: 125       -variability: moderate       -accels: present, 15x15       -decels: prolonged + repetitive variables       -TOCO: irregular ctx -fetal deceleration on monitor. Discussed with Dr. Nehemiah Settle, will perform C/S today. Patient last ate Fruit Loops with 2% milk at 945AM with water to drink. Nothing since then. COVID swab reentered as STAT. Pt informed that the ultrasound is considered a limited OB ultrasound and is not intended to be a complete ultrasound exam.  Patient also informed that the ultrasound is not being completed with the intent of assessing for fetal or placental anomalies or any pelvic abnormalities.  Explained that the purpose of today's ultrasound is to assess for  presentation.  Patient acknowledges the purpose of the exam and the limitations of the study.   -Bedside US shows Twin A with head to maternal left. Twin B with head centrally located below epigastric area -Dr. Nehemiah Settle to bedside to discuss plan with patient -prepare for OR  Orders Placed This Encounter  Procedures  . Resp Panel by RT-PCR (Flu A&B, Covid) Nasopharyngeal Swab    Standing Status:   Standing    Number of Occurrences:   1    Order Specific Question:   Is this test for diagnosis or screening    Answer:   Screening    Order Specific Question:   Symptomatic for COVID-19 as defined by CDC    Answer:   No    Order Specific Question:   Hospitalized for COVID-19    Answer:   No    Order Specific Question:   Admitted to ICU for COVID-19    Answer:   No    Order Specific Question:   Previously tested for COVID-19    Answer:   Unknown    Order Specific Question:   Resident in a congregate  (group) care setting    Answer:   No    Order Specific Question:   Employed in healthcare setting    Answer:   Unknown    Order Specific Question:   Pregnant    Answer:   Yes    Order Specific Question:   Has patient completed COVID vaccination(s) (2 doses of Pfizer/Moderna 1 dose of [employer])    Answer:   Unknown  . Diet NPO time specified    Standing Status:  Standing    Number of Occurrences:   1  . Insert peripheral IV    Standing Status:   Standing    Number of Occurrences:   1   Meds ordered this encounter  Medications  . lactated ringers bolus 1,000 mL    Assessment and Plan   1. Dichorionic diamniotic twin pregnancy in third trimester   2. [redacted] weeks gestation of pregnancy   3. Fetal heart rate decelerations affecting management of mother   4. Swelling of thigh    -prepare for OR  Gerrie Nordmann Kathy Harris 06/05/2020, 1:12 PM

## 2020-06-05 NOTE — Anesthesia Preprocedure Evaluation (Addendum)
Anesthesia Evaluation  Patient identified by MRN, date of birth, ID band Patient awake    Reviewed: Allergy & Precautions, NPO status , Patient's Chart, lab work & pertinent test results  History of Anesthesia Complications Negative for: history of anesthetic complications  Airway Mallampati: II  TM Distance: >3 FB Neck ROM: Full    Dental   Pulmonary neg pulmonary ROS,    Pulmonary exam normal        Cardiovascular negative cardio ROS Normal cardiovascular exam     Neuro/Psych negative neurological ROS  negative psych ROS   GI/Hepatic negative GI ROS, Neg liver ROS,   Endo/Other  Morbid obesity  Renal/GU negative Renal ROS  negative genitourinary   Musculoskeletal negative musculoskeletal ROS (+)   Abdominal   Peds  Hematology negative hematology ROS (+)   Anesthesia Other Findings   Reproductive/Obstetrics (+) Pregnancy (breech twins; no prior C/S)                            Anesthesia Physical Anesthesia Plan  ASA: III and emergent  Anesthesia Plan: Spinal   Post-op Pain Management:    Induction:   PONV Risk Score and Plan: Ondansetron and Treatment may vary due to age or medical condition  Airway Management Planned: Natural Airway  Additional Equipment: None  Intra-op Plan:   Post-operative Plan:   Informed Consent: I have reviewed the patients History and Physical, chart, labs and discussed the procedure including the risks, benefits and alternatives for the proposed anesthesia with the patient or authorized representative who has indicated his/her understanding and acceptance.       Plan Discussed with:   Anesthesia Plan Comments: (Pt with breech twins and nonreassuring fetal heart tracing. Will proceed to OR for C/S under spinal.)       Anesthesia Quick Evaluation

## 2020-06-05 NOTE — Discharge Summary (Signed)
Postpartum Discharge Summary      Patient Name: Kathy Harris DOB: Nov 06, 2000 MRN: [id number]  Date of admission: 06/05/2020 Delivery date:   Kathy Harris [[id number]]  06/05/2020    Kathy Harris [[id number]]  06/05/2020  Delivering provider:    Tera Partridge Harris [[id number]]  Kathy Harris [[id number]]  Kathy Harris  Date of discharge: 06/09/2020  Admitting diagnosis: Cesarean delivery delivered [O82] Intrauterine pregnancy: [redacted]w[redacted]d    Secondary diagnosis:  Active Problems:   Twin pregnancy   Supervision of high risk pregnancy in second trimester   Cesarean delivery delivered   Non-reassuring fetal heart rate or rhythm affecting management of mother  Additional problems: none    Discharge diagnosis: Term Pregnancy Delivered                                              Post partum procedures:none Augmentation: N/A Complications: None  Hospital course: stat cesarean section for NRFHT  Patient presented to MAU 06/05/20 for left leg pain and was ruled out for DVT at that time. While in the MAU NRFHT with prolonged deceleration and subsequent recovery to baseline. Decision made at that time to schedule cesarean section. Upon arrival to the operating room Twin B was found to be bradycardic so stat cesarean section was called at that time. Uncomplicated cesarean delivery and uneventful post operative course.  Magnesium Sulfate received: No BMZ received: No Rhophylac:N/A MMR:N/A T-DaP:declined Flu: declined Transfusion:No  Physical exam  Vitals:   06/07/20 0600 06/07/20 1620 06/08/20 0100 06/08/20 0632  BP: (!) 106/55 120/77 111/68 115/67  Pulse: 61 63 78 63  Resp: _0 Temp: 98.2 F (36.8 C) 98 F (36.7 C) (!) 97.5 F (36.4 C) 98.5 F (36.9 C)  TempSrc: Oral Oral Oral Oral  SpO2: 96%  98% 99%  Weight:       General: alert, cooperative, and no distress Lochia: appropriate Uterine Fundus: firm Incision: N/A DVT  Evaluation: No evidence of DVT seen on physical exam. Negative Homan's sign. No cords or calf tenderness. Labs: Lab Results  Component Value Date   WBC 11.1 (H) 06/06/2020   HGB 9.7 (L) 06/06/2020   HCT 26.9 (L) 06/06/2020   MCV 89.1 06/06/2020   PLT 250 06/06/2020   CMP Latest Ref Rng & Units 12/16/2019  Glucose 70 - 99 mg/dL 96  BUN 6 - 20 mg/dL 6  Creatinine 0.44 - 1.00 mg/dL 0.59  Sodium 135 - 145 mmol/L 134(L)  Potassium 3.5 - 5.1 mmol/L 3.6  Chloride 98 - 111 mmol/L 102  CO2 22 - 32 mmol/L 21(L)  Calcium 8.9 - 10.3 mg/dL 8.8(L)  Total Protein 6.5 - 8.1 g/dL 7.2  Total Bilirubin 0.3 - 1.2 mg/dL 0.7  Alkaline Phos 38 - 126 U/L 91  AST 15 - 41 U/L 73(H)  ALT 0 - 44 U/L 112(H)   Kathy Harris: Edinburgh Postnatal Depression Scale Screening Tool 06/08/2020  I have been able to laugh and see the funny side of things. 0  I have looked forward with enjoyment to things. 0  I have blamed myself unnecessarily when things went wrong. 2  I have been anxious or worried for no good reason. 2  I have felt scared or panicky for no good reason. 0  Things have been getting  on top of me. 1  I have been so unhappy that I have had difficulty sleeping. 0  I have felt sad or miserable. 0  I have been so unhappy that I have been crying. 0  The thought of harming myself has occurred to me. 0  Edinburgh Postnatal Depression Scale Total 5     After visit meds:  Allergies as of 06/08/2020   No Known Allergies      Medication List     STOP taking these medications    aspirin 81 MG chewable tablet   Blood Pressure Monitor Kit   Comfort Fit Maternity Supp Sm Misc   folic acid 1 MG tablet Commonly known as: FOLVITE   prenatal multivitamin Tabs tablet       TAKE these medications    ferrous sulfate 325 (65 FE) MG tablet Take 1 tablet (325 mg total) by mouth every other day. What changed: when to take this   ibuprofen 800 MG tablet Commonly known as: ADVIL Take 1 tablet  (800 mg total) by mouth every 6 (six) hours.   oxyCODONE 5 MG immediate release tablet Commonly known as: Oxy IR/ROXICODONE Take 1-2 tablets (5-10 mg total) by mouth every 6 (six) hours as needed for up to 5 days for moderate pain.         Discharge home in stable condition Infant Feeding: Bottle and Breast Infant Disposition:home with mother Discharge instruction: per After Visit Summary and Postpartum booklet. Activity: Advance as tolerated. Pelvic rest for 6 weeks.  Diet: routine diet Future Appointments: Future Appointments  Date Time Provider [place]  06/13/2020 10:00 AM [place] None  07/04/2020 11:00 AM Griffin Basil, MD [place] None   Follow up Visit:  [place] Follow up on 07/04/2020.   Why: for postparutm checkup Contact information: [address] [place] [id number] [phone]               Message sent to [facility] by Sylvester Harder 06/05/20.   Please schedule this patient for a In person postpartum visit in 4 weeks with the following provider: Any provider. Additional Postpartum F/U:Incision check 1 week  High risk pregnancy complicated by:  twin gestation Delivery mode:     Kathy Harris [[id number]]  C-Section, Low Kathy Harris [[id number]]  C-Section, Low Transverse  Anticipated Birth Control:   declines   06/09/2020 Kathy Harris, CNM

## 2020-06-05 NOTE — Anesthesia Procedure Notes (Signed)
Spinal  Patient location during procedure: OR Staffing Performed: anesthesiologist  Anesthesiologist: Masao Junker E, MD Preanesthetic Checklist Completed: patient identified, IV checked, risks and benefits discussed, surgical consent, monitors and equipment checked, pre-op evaluation and timeout performed Spinal Block Patient position: sitting Prep: DuraPrep and site prepped and draped Patient monitoring: continuous pulse ox, blood pressure and heart rate Approach: midline Location: L3-4 Injection technique: single-shot Needle Needle type: Pencan  Needle gauge: 24 G Needle length: 9 cm Additional Notes Functioning IV was confirmed and monitors were applied. Sterile prep and drape, including hand hygiene and sterile gloves were used. The patient was positioned and the spine was prepped. The skin was anesthetized with lidocaine.  Free flow of clear CSF was obtained prior to injecting local anesthetic into the CSF. The needle was carefully withdrawn. The patient tolerated the procedure well.      

## 2020-06-06 ENCOUNTER — Encounter (HOSPITAL_COMMUNITY): Payer: Self-pay | Admitting: Family Medicine

## 2020-06-06 LAB — CBC
HCT: 26.9 % — ABNORMAL LOW (ref 36.0–46.0)
Hemoglobin: 9.7 g/dL — ABNORMAL LOW (ref 12.0–15.0)
MCH: 32.1 pg (ref 26.0–34.0)
MCHC: 36.1 g/dL — ABNORMAL HIGH (ref 30.0–36.0)
MCV: 89.1 fL (ref 80.0–100.0)
Platelets: 250 10*3/uL (ref 150–400)
RBC: 3.02 MIL/uL — ABNORMAL LOW (ref 3.87–5.11)
RDW: 11.9 % (ref 11.5–15.5)
WBC: 11.1 10*3/uL — ABNORMAL HIGH (ref 4.0–10.5)
nRBC: 0 % (ref 0.0–0.2)

## 2020-06-06 MED ORDER — LACTATED RINGERS IV BOLUS
1000.0000 mL | Freq: Once | INTRAVENOUS | Status: AC
  Administered 2020-06-06: 05:00:00 1000 mL via INTRAVENOUS

## 2020-06-06 MED ORDER — FERROUS SULFATE 325 (65 FE) MG PO TABS
325.0000 mg | ORAL_TABLET | ORAL | Status: DC
  Administered 2020-06-06 – 2020-06-08 (×2): 325 mg via ORAL
  Filled 2020-06-06 (×2): qty 1

## 2020-06-06 NOTE — Progress Notes (Signed)
POSTPARTUM PROGRESS NOTE  Subjective: Kathy Harris is a 19 y.o. [id number] s/p stat pLTCS at [redacted]w[redacted]d. Mercie Eon twins.  She reports she doing well. No acute events overnight. She denies any problems with po intake. Denies nausea or vomiting. Foley catheter still in place. Has stood at bedside without issue. She has not passed flatus. Pain is well controlled.  Lochia is moderate.  Objective: Blood pressure 109/61, pulse 84, temperature 98.3 F (36.8 C), temperature source Oral, resp. rate 18, weight 96.2 kg, last menstrual period 09/08/2019, SpO2 100 %, unknown if currently breastfeeding.  Physical Exam:  General: alert, cooperative and no distress Chest: no respiratory distress Abdomen: soft, non-tender  Uterine Fundus: firm and at level of umbilicus, pressure dressing in place c/d/i Extremities: No calf swelling or tenderness  no edema  Recent Labs    06/05/20 0941 06/06/20 0656  HGB 12.6 9.7*  HCT 35.6* 26.9*    Assessment/Plan: Kathy Harris is a 19 y.o. [id number] s/p stat pLTCS at [redacted]w[redacted]d for NRFHT, Mercie Eon Twins.  Routine Postpartum Care: Doing well, pain well-controlled.  -- Continue routine care, lactation support  -- Contraception: discussed at bedside, patient still undecided  -- Feeding: both -- Female infants, consented for circumcision  --PreOp hgb 12.6, Post Op 9.7   Dispo: Plan for discharge POD 2 or 3.  Gita Kudo, MD OB Fellow, Faculty [facility] 06/06/2020 7:59 AM

## 2020-06-06 NOTE — Anesthesia Postprocedure Evaluation (Signed)
Anesthesia Post Note  Patient: Kathy Harris  Procedure(s) Performed: CESAREAN SECTION MULTI-GESTATIONAL (N/A Abdomen)     Patient location during evaluation: PACU Anesthesia Type: Spinal Level of consciousness: oriented and awake and alert Pain management: pain level controlled Vital Signs Assessment: post-procedure vital signs reviewed and stable Respiratory status: spontaneous breathing, respiratory function stable and nonlabored ventilation Cardiovascular status: blood pressure returned to baseline and stable Postop Assessment: no headache, no backache, no apparent nausea or vomiting and spinal receding Anesthetic complications: no   No complications documented.  Last Vitals:  Vitals:   06/06/20 0424 06/06/20 0845  BP: 109/61 107/64  Pulse: 84 72  Resp: 18 16  Temp: 36.8 C 36.6 C  SpO2: 100% 96%    Last Pain:  Vitals:   06/06/20 0845  TempSrc: Oral  PainSc: 0-No pain                 Lucretia Kern

## 2020-06-06 NOTE — Lactation Note (Signed)
This note was copied from a baby's chart. Lactation Consultation Note  Patient Name: Kathy Harris [id number] Date: 06/06/2020 Reason for consult: Initial assessment;Multiple gestation;1st time breastfeeding;Primapara   P2 mother whose infant twins are now 30 hours old.  These twin boys are 37+5 weeks weighing > 6 lbs.  Mother's feeding preference is breast/bottle.  Lactation order was entered at 0858 today.  Upon arrival, parents were formula feeding the twins.  Baby "A" has attempted breast feeding twice while Baby "B" has been formula fed.  After speaking with mother she does desire to learn to breast feed.  Suggested mother call her RN/LC with the next feeding (approximately 1700) to assist with latching.  Mother verbalized understanding.  Also suggested mother begin pumping with the DEBP to stimulate breasts to encourage a good milk supply.  Mother was interested.  Pump parts, assembly, disassembly and cleaning reviewed.  Observed mother pumping for 5 minutes prior to my departure and she was already obtaining EBM.  Praised mother for her efforts with pumping and obtaining some EBM.  Mother pleased.  Colostrum containers provided and milk storage times reviewed.  Finger feeding demonstrated.  Feeding supplementation guidelines provided and reviewed with mother.  Both babies are currently taking adequate volumes and thanked father for doing a good job with supplementation and record keeping with the feeding log.  The babies have been using the purple extra slow flow nipple with ease.  Mom made aware of O/P services, breastfeeding support groups, community resources, and our phone # for post-discharge questions.  Mother has a DEBP for home use.     Maternal Data Formula Feeding for Exclusion: Yes Reason for exclusion: Mother's choice to formula and breast feed on admission Has patient been taught Hand Expression?: Yes Does the patient have breastfeeding experience prior to this delivery?:  No  Feeding    LATCH Score                   Interventions    Lactation Tools Discussed/Used Pump Review: Setup, frequency, and cleaning;Milk Storage Initiated by:: [name] Date initiated:: 06/06/20   Consult Status Consult Status: Follow-up Date: 06/07/20 Follow-up type: In-patient    [name] 06/06/2020, 2:34 PM

## 2020-06-07 ENCOUNTER — Inpatient Hospital Stay (HOSPITAL_COMMUNITY)
Admission: RE | Admit: 2020-06-07 | Payer: Medicaid Other | Source: Ambulatory Visit | Admitting: Obstetrics & Gynecology

## 2020-06-07 ENCOUNTER — Other Ambulatory Visit: Payer: Medicaid Other

## 2020-06-07 LAB — SURGICAL PATHOLOGY

## 2020-06-07 NOTE — Lactation Note (Signed)
This note was copied from a baby's chart. Lactation Consultation Note  Patient Name: Sharvi Mooneyhan [id number] Date: 06/07/2020   Age:19 hours [place] babies now 52 hours hours old.  Mom reports she has been breastfeeding first and then following up with formula.  Mom reports she hasn't really been pumping.  Discussed importance for pumping past breastfeeds to establish healthy supply for later.  Discussed trying to get it in during the day if unable to do so at night.  Urged mom to feed whatever milk she pumped first and  then follow up with formula   Mom reports breasts and nipples feel fine and she feels they have been breastfeeding well.  Urged her to call lactation as needed. Maternal Data    Feeding Feeding Type: Breast Fed  [facility] Score                   Interventions    Lactation Tools Discussed/Used     Consult Status      Shauntae Reitman Michaelle Copas 06/07/2020, 8:31 PM

## 2020-06-07 NOTE — Progress Notes (Signed)
POSTPARTUM PROGRESS NOTE  Subjective: Kathy Harris is a 19 y.o. [id number] s/p stat pLTCS at [redacted]w[redacted]d. Mercie Eon twins.  She reports she doing well. No acute events overnight. She denies any problems with po intake. Denies nausea or vomiting.Urinating normally. Has had a bowel movement. She has not passed flatus. Pain is well controlled.  Lochia is moderate.  Objective: Blood pressure (!) 106/55, pulse 61, temperature 98.2 F (36.8 C), temperature source Oral, resp. rate 19, weight 96.2 kg, last menstrual period 09/08/2019, SpO2 96 %, unknown if currently breastfeeding.  Physical Exam:  General: alert, cooperative and no distress Chest: no respiratory distress Abdomen: soft, non-tender  Uterine Fundus: firm and at level of umbilicus, pressure dressing in place c/d/i Extremities: No calf swelling or tenderness  no edema  Recent Labs    06/05/20 0941 06/06/20 0656  HGB 12.6 9.7*  HCT 35.6* 26.9*    Assessment/Plan: [name] is a 19 y.o. [id number] s/p stat pLTCS at [redacted]w[redacted]d for NRFHT, Mercie Eon Twins. POD#2.  Routine Postpartum Care: Doing well, pain well-controlled.  -- Continue routine care, lactation support  -- Contraception: discussed at bedside, patient still undecided  -- Feeding: both -- Female infants, consented for circumcision  --PreOp hgb 12.6, Post Op 9.7   Dispo: Plan for discharge POD 3.  Gita Kudo, MD OB Fellow, Faculty [facility] 06/07/2020 8:12 AM

## 2020-06-08 MED ORDER — FERROUS SULFATE 325 (65 FE) MG PO TABS: 325.0000 mg | ORAL_TABLET | ORAL | 1 refills | Status: DC

## 2020-06-08 MED ORDER — IBUPROFEN 800 MG PO TABS: 800.0000 mg | ORAL_TABLET | Freq: Four times a day (QID) | ORAL | 0 refills | Status: DC

## 2020-06-08 MED ORDER — OXYCODONE HCL 5 MG PO TABS: 5.0000 mg | ORAL_TABLET | Freq: Four times a day (QID) | ORAL | 0 refills | Status: AC | PRN

## 2020-06-08 NOTE — Lactation Note (Signed)
This note was copied from a baby's chart. Lactation Consultation Note  Patient Name: Kathy Harris [id number] Date: 06/08/2020 Reason for consult: Follow-up assessment Age:19 hours   P2 mother whose infant twins are now 16 hours old.  These twin boys are early term infants at 37+5 weeks weighing > 6 lbs.  Mother's feeding preference is breast/bottle.   Mother has been primarily bottle feeding her babies.  She is aware to breast feed prior to any formula supplementation and to pump every three hours for breast stimulation and to help increase milk supply.  Engorgement prevention/treatment reviewed.  Mother has a manual pump and a DEBP for home use.  Father present.  Babies have been discharged.  RN updated and will follow up with her discharge instructions.   Maternal Data    Feeding Feeding Type: Formula Nipple Type: Slow - flow  LATCH Score                   Interventions    Lactation Tools Discussed/Used     Consult Status Consult Status: Complete Date: 06/08/20 Follow-up type: Call as needed    Martyn Timme R Adrine Hayworth 06/08/2020, 11:51 AM

## 2020-06-13 ENCOUNTER — Ambulatory Visit: Payer: Medicaid Other

## 2020-06-13 ENCOUNTER — Other Ambulatory Visit: Payer: Self-pay

## 2020-06-13 ENCOUNTER — Other Ambulatory Visit (HOSPITAL_COMMUNITY): Payer: Medicaid Other

## 2020-06-13 ENCOUNTER — Encounter (HOSPITAL_COMMUNITY): Admission: RE | Admit: 2020-06-13 | Payer: Medicaid Other | Source: Ambulatory Visit

## 2020-06-13 VITALS — BP 130/88 | HR 69 | Temp 99.1°F

## 2020-06-13 DIAGNOSIS — Z98891 History of uterine scar from previous surgery: Secondary | ICD-10-CM

## 2020-06-13 NOTE — Progress Notes (Signed)
Patient was assessed and managed by nursing staff during this encounter. I have reviewed the chart and agree with the documentation and plan. I have also made any necessary editorial changes.  Carnell Casamento A Kortney Schoenfelder, MD 06/13/2020 12:15 PM   

## 2020-06-13 NOTE — Progress Notes (Signed)
PP pt presents for incision check following C-Section of twin delivery on 06/05/20. Pt took honey comb dressing off at home .  Incision appears to be dry, clean with no redness or signs of infection.   Patient advised to keep post partum appt.on 07/05/19. Incision care instructions discussed  Advised to call the office for any concerns.  Pt agreeable and voiced understanding.

## 2020-07-04 ENCOUNTER — Ambulatory Visit: Payer: PRIVATE HEALTH INSURANCE | Admitting: Obstetrics and Gynecology

## 2020-07-13 ENCOUNTER — Encounter: Payer: Self-pay | Admitting: Obstetrics and Gynecology

## 2020-07-13 ENCOUNTER — Ambulatory Visit (INDEPENDENT_AMBULATORY_CARE_PROVIDER_SITE_OTHER): Payer: PRIVATE HEALTH INSURANCE | Admitting: Obstetrics and Gynecology

## 2020-07-13 ENCOUNTER — Other Ambulatory Visit: Payer: Self-pay

## 2020-07-13 DIAGNOSIS — Z30011 Encounter for initial prescription of contraceptive pills: Secondary | ICD-10-CM

## 2020-07-13 MED ORDER — NORETHINDRONE 0.35 MG PO TABS: 1.0000 | ORAL_TABLET | Freq: Every day | ORAL | 11 refills | Status: DC

## 2020-07-13 NOTE — Progress Notes (Signed)
Post Partum Visit Note  Kathy Harris is a 20 y.o. G15P1012 female who presents for a postpartum visit. She is 5 weeks postpartum following a primary cesarean section.  I have fully reviewed the prenatal and intrapartum course. The delivery was at 37 gestational weeks.  Anesthesia: spinal. Postpartum course has been normal. Babies are doing well. Babies are feeding by both breast and bottle - Similac Ready to Use. Bleeding red. Bowel function is normal. Bladder function is normal. Patient is sexually active. Contraception method is coitus interruptus. Postpartum depression screening: negative. Score: 1   The pregnancy intention screening data noted above was reviewed. Potential methods of contraception were discussed. The patient elected to proceed with No Method - Other Reason. After further discussion, pt will use progesterone only oral contraceptives.   Edinburgh Postnatal Depression Scale - 07/13/20 1026      Edinburgh Postnatal Depression Scale:  In the Past 7 Days   I have been able to laugh and see the funny side of things. 0    I have looked forward with enjoyment to things. 0    I have blamed myself unnecessarily when things went wrong. 1    I have been anxious or worried for no good reason. 0    I have felt scared or panicky for no good reason. 0    Things have been getting on top of me. 0    I have been so unhappy that I have had difficulty sleeping. 0    I have felt sad or miserable. 0    I have been so unhappy that I have been crying. 0    The thought of harming myself has occurred to me. 0    Edinburgh Postnatal Depression Scale Total 1            The following portions of the patient's history were reviewed and updated as appropriate: allergies, current medications, past family history, past medical history, past social history, past surgical history and problem list.  Review of Systems Pertinent items are noted in HPI.    Objective:  BP 122/74 (BP Location:  Right Arm, Patient Position: Sitting, Cuff Size: Normal)   Pulse 88   Ht 4\' 11"  (1.499 m)   Wt 175 lb 6.4 oz (79.6 kg)   BMI 35.43 kg/m    General:  alert, cooperative and no distress   Breasts:  deferred  Lungs: clear to auscultation bilaterally  Heart:  regular rate and rhythm  Abdomen: soft, non-tender; bowel sounds normal; no masses,  no organomegaly and c/s scar well healed, decreased sensation at scar site   Vulva:  not evaluated  Vagina: not evaluated  Cervix:  not evaluated  Corpus: not examined  Adnexa:  not evaluated  Rectal Exam: Not performed.        Assessment:    normal postpartum exam. Pap smear not done at today's visit.   Plan:   Essential components of care per ACOG recommendations:  1.  Mood and well being: Patient with negative depression screening today. Reviewed local resources for support.  - Patient does not use tobacco.  - hx of drug use? No    2. Infant care and feeding:  -Patient currently breastmilk feeding? Yes If breastmilk feeding discussed return to work and pumping. If needed, patient was provided letter for work to allow for every 2-3 hr pumping breaks, and to be granted a private location to express breastmilk and refrigerated area to store breastmilk. Reviewed importance of  draining breast regularly to support lactation. -Social determinants of health (SDOH) reviewed in EPIC. No concerns  3. Sexuality, contraception and birth spacing - Patient does not want a pregnancy in the next year.  Desired family size is 5 children.  - Reviewed forms of contraception in tiered fashion. Patient desired oral progesterone-only contraceptive today.  rx for micronor sent - Discussed birth spacing of 18 months  4. Sleep and fatigue -Encouraged family/partner/community support of 4 hrs of uninterrupted sleep to help with mood and fatigue  5. Physical Recovery  - Discussed patients delivery and complications - Patient has urinary incontinence? No -  Patient is safe to resume physical and sexual activity  6.  Health Maintenance Annual exam in 1 year, first pap at age 31   Warden Fillers, MD [facility] [employer], [facility]

## 2020-07-13 NOTE — Patient Instructions (Signed)
Oral Contraception Use Oral contraceptive pills (OCPs) are medicines that prevent pregnancy. OCPs work by:  Preventing the ovaries from releasing eggs.  Thickening mucus in the lower part of the uterus (cervix). This prevents sperm from entering the uterus.  Thinning the lining of the uterus (endometrium). This prevents a fertilized egg from attaching to the endometrium. Discuss possible side effects of OCPs with your health care provider. It can take 2-3 months for your body to adjust to changes in hormone levels. What are the risks? OCPs can sometimes cause side effects, such as:  Headache.  Depression.  Trouble sleeping.  Nausea and vomiting.  Breast tenderness.  Irregular bleeding or spotting during the first several months.  Bloating or fluid retention.  Increase in blood pressure. OCPs with estrogen and progestins may slightly increase the risk of:  Blood clots.  Heart attack.  Stroke. How to take OCPs Follow instructions from your health care provider about how to take your first cycle of OCPs. There are 2 types of OCPs. The first, combination OCPs, have both estrogen and progestins. The second, progestin-only pills, have only progestin.  For combination OCPs, you may start the pill: ? On day 1 of your menstrual period. ? On the first Sunday after your period starts, or on the day you get your prescription. ? At any time of your cycle. ? If you start taking the pill within 5 days after the start of your period, you will not need a backup form of birth control, such as condoms. ? If you start at any other time of your menstrual cycle, you will need to use a backup form of birth control.  For progestin-only OCPs: ? Ideally, you can start taking the pill on the first day of your menstrual period, but you can start it on any other day too. ? These pills will protect you from pregnancy after taking it for 2 days (48 hours). You can stop using a backup form of birth  control after that time. It is important that you take this pill at the same time every day. Even taking it 3 hours late can increase the risk of pregnancy. No matter which day you start the OCP, you will always start a new pack on that same day of the week. Have an extra pack of OCPs and a backup contraceptive method available in case you miss some pills or lose your OCP pack. Missed doses Follow instructions from your health care provider for missed doses. Information about missed doses can also be found in the patient information sheet that comes with your pack of pills.  In general, for combined OCPs: ? If you forget to take the pill for 1 day, take it as soon as you can. This may mean taking 2 pills on the same day and at the same time. Take the next day's pill at the regular time. ? If you forget to take the pill for 2 days in a row, take 2 tablets on the day you remember and 2 tablets on the following day. A backup form of birth control should be used for 7 days after you are back on schedule. ? If you forget to take the pill for 3 days in a row, call your health care provider for directions on when to restart taking your pills. Do not take the missed pills. A backup form of birth control will be needed for 7 days once you restart your pills. ? If you use a pack that   contains inactive pills and you miss 1 or more of the inactive pills, you do not need to take the missed doses. Skip them and start the new pack on the regular day.  For progestin-only OCPs: ? If your dose is 3 hours or more late, or if you miss 1 or more doses, take 1 missed pill as soon as you can. ? If you miss one or more doses, you must use a backup form of birth control. Some brands of progestin-only pills recommend using a backup form of birth control for 48 hours after a missed or late dose while others recommend 7 days. If you are not sure what to do, call your health care provider or check the patient information sheet that  came with your pills.   Follow these instructions at home:  Do not use any products that contain nicotine or tobacco. These include cigarettes, chewing tobacco, or vaping devices, such as e-cigarettes. If you need help quitting, ask your health care provider.  Always use a condom to protect against STIs (sexually transmitted infections). Oral contraception pills do not protect against STIs.  Use a calendar to mark the days of your menstrual period.  Read the information sheet and directions that came with your OCP. Talk to your health care provider if you have questions. Contact a health care provider if:  You develop nausea and vomiting.  You have abnormal vaginal discharge or bleeding.  You develop a rash.  You miss your menstrual period. Depending on the type of OCP you are taking, this may be a sign of pregnancy.  You are losing your hair.  You need treatment for mood swings or depression.  You get dizzy when taking the OCP.  You develop acne after taking the OCP.  You become pregnant or think you may be pregnant.  You have diarrhea, constipation, and abdominal pain or cramps.  You are not sure what to do after missing pills. Get help right away if:  You develop chest pain.  You develop shortness of breath.  You have an uncontrolled or severe headache.  You develop numbness or slurred speech.  You develop vision or speech problems.  You develop pain, redness, and swelling in your legs.  You develop weakness or numbness in your arms or legs. These symptoms may represent a serious problem that is an emergency. Do not wait to see if the symptoms will go away. Get medical help right away. Call your local emergency services (911 in the [place] Do not drive yourself to the hospital. Summary  Oral contraceptive pills (OCPs) are medicines that you take to prevent pregnancy.  OCPs do not prevent sexually transmitted infections (STIs). Always use a condom to protect  against STIs.  When you start an OCP, be aware that it can take 2-3 months for your body to adjust to changes in hormone levels.  Read all the information and directions that come with your OCP. This information is not intended to replace advice given to you by your health care provider. Make sure you discuss any questions you have with your health care provider. Document Revised: 02/09/2020 Document Reviewed: 02/09/2020 Elsevier Patient Education  2021 Elsevier Inc.  

## 2021-03-06 ENCOUNTER — Encounter (HOSPITAL_COMMUNITY): Payer: Self-pay

## 2021-03-06 ENCOUNTER — Emergency Department (HOSPITAL_COMMUNITY)
Admission: EM | Admit: 2021-03-06 | Discharge: 2021-03-06 | Disposition: A | Discharge status: Home / Self Care | Payer: BLUE CROSS/BLUE SHIELD | Attending: Emergency Medicine | Admitting: Emergency Medicine

## 2021-03-06 ENCOUNTER — Other Ambulatory Visit: Payer: Self-pay

## 2021-03-06 DIAGNOSIS — N72 Inflammatory disease of cervix uteri: Secondary | ICD-10-CM | POA: Diagnosis not present

## 2021-03-06 DIAGNOSIS — R309 Painful micturition, unspecified: Secondary | ICD-10-CM | POA: Diagnosis present

## 2021-03-06 DIAGNOSIS — G43909 Migraine, unspecified, not intractable, without status migrainosus: Secondary | ICD-10-CM | POA: Diagnosis not present

## 2021-03-06 DIAGNOSIS — R102 Pelvic and perineal pain: Secondary | ICD-10-CM | POA: Diagnosis not present

## 2021-03-06 DIAGNOSIS — B9689 Other specified bacterial agents as the cause of diseases classified elsewhere: Secondary | ICD-10-CM | POA: Insufficient documentation

## 2021-03-06 LAB — URINALYSIS, MICROSCOPIC (REFLEX): WBC, UA: >50 WBC/hpf (ref 0–5)

## 2021-03-06 LAB — URINALYSIS, ROUTINE W REFLEX MICROSCOPIC
Bilirubin Urine: NEGATIVE
Glucose, UA: NEGATIVE mg/dL
Ketones, ur: 15 mg/dL — AB
Nitrite: NEGATIVE
Protein, ur: 100 mg/dL — AB
Specific Gravity, Urine: 1.02 (ref 1.005–1.030)
pH: 7 (ref 5.0–8.0)

## 2021-03-06 LAB — CBC
HCT: 42.9 % (ref 36.0–46.0)
Hemoglobin: 14.2 g/dL (ref 12.0–15.0)
MCH: 30 pg (ref 26.0–34.0)
MCHC: 33.1 g/dL (ref 30.0–36.0)
MCV: 90.5 fL (ref 80.0–100.0)
Platelets: 368 10*3/uL (ref 150–400)
RBC: 4.74 MIL/uL (ref 3.87–5.11)
RDW: 12.1 % (ref 11.5–15.5)
WBC: 7.3 10*3/uL (ref 4.0–10.5)
nRBC: 0 % (ref 0.0–0.2)

## 2021-03-06 LAB — BASIC METABOLIC PANEL
Anion gap: 11 (ref 5–15)
BUN: 7 mg/dL (ref 6–20)
CO2: 22 mmol/L (ref 22–32)
Calcium: 9.2 mg/dL (ref 8.9–10.3)
Chloride: 103 mmol/L (ref 98–111)
Creatinine, Ser: 0.85 mg/dL (ref 0.44–1.00)
GFR, Estimated: >60 mL/min (ref >60)
Glucose, Bld: 119 mg/dL — ABNORMAL HIGH (ref 70–99)
Potassium: 4.2 mmol/L (ref 3.5–5.1)
Sodium: 136 mmol/L (ref 135–145)

## 2021-03-06 LAB — WET PREP, GENITAL
Sperm: NONE SEEN
Trich, Wet Prep: NONE SEEN
Yeast Wet Prep HPF POC: NONE SEEN

## 2021-03-06 LAB — I-STAT BETA HCG BLOOD, ED (MC, WL, AP ONLY): I-stat hCG, quantitative: <5 m[IU]/mL (ref <5)

## 2021-03-06 LAB — HIV ANTIBODY (ROUTINE TESTING W REFLEX): HIV Screen 4th Generation wRfx: NONREACTIVE

## 2021-03-06 MED ORDER — ONDANSETRON 4 MG PO TBDP
4.0000 mg | ORAL_TABLET | Freq: Once | ORAL | Status: AC
  Administered 2021-03-06: 4 mg via ORAL
  Filled 2021-03-06: qty 1

## 2021-03-06 MED ORDER — IBUPROFEN 800 MG PO TABS
800.0000 mg | ORAL_TABLET | Freq: Once | ORAL | Status: AC
  Administered 2021-03-06: 800 mg via ORAL
  Filled 2021-03-06: qty 2

## 2021-03-06 MED ORDER — DOXYCYCLINE HYCLATE 100 MG PO CAPS: 100.0000 mg | ORAL_CAPSULE | Freq: Two times a day (BID) | ORAL | 0 refills | Status: DC

## 2021-03-06 MED ORDER — CEFTRIAXONE SODIUM 500 MG IJ SOLR
500.0000 mg | Freq: Once | INTRAMUSCULAR | Status: AC
  Administered 2021-03-06: 500 mg via INTRAMUSCULAR
  Filled 2021-03-06: qty 500

## 2021-03-06 NOTE — ED Triage Notes (Signed)
BIB by [facility] from home. C/o headache and sore throat x 3 days. N/V started around midnight none with EMS. Per EMS slept on the way here

## 2021-03-06 NOTE — ED Triage Notes (Signed)
Headache x 3 days with vomiting starting this am. 10/10 throbbing, denies any hx of headaches. Light sensitivity

## 2021-03-06 NOTE — ED Provider Notes (Signed)
[name] EMERGENCY DEPARTMENT Provider Note   CSN: [id number] Arrival date & time: 03/06/21  0349     History Chief Complaint  Patient presents with   Migraine    Kathy Harris is a 20 y.o. female.  The history is provided by the patient. No language interpreter was used.  Migraine   20 year old female significant history of anemia who presents with complaints of urinary discomfort.  Patient report for the past 3 days she has noticed increasing urinary discomfort, urinary more than usual and feels as if she cannot completely finish her urine.  She also reported seeing blood in her urine. she thought it could be due to urinary tract infection and started to take cranberry pills without relief.  Yesterday she noticed pain to her flank, states she is nauseous, vomiting, having some loose stools and today endorse some throbbing headache.  She denies fever but does endorse chills.  She does not complain of any significant abdominal pain.  She did admits to having unprotected sex recently and Noticed some increased vaginal discharge.  Her last menstrual period was on 8/24.  She has been taking Tylenol for her headache without adequate relief.  She was concern for potential of being pregnant.  She has remote history of STI including chlamydia.  Past Medical History:  Diagnosis Date   Anemia    Phreesia 05/31/2020   Medical history non-contributory     Patient Active Problem List   Diagnosis Date Noted   Encounter for routine postpartum follow-up 07/13/2020   Cesarean delivery delivered 06/05/2020   Non-reassuring fetal heart rate or rhythm affecting management of mother 06/05/2020   Supervision of high risk pregnancy in second trimester 02/01/2020   Twin pregnancy 01/04/2020    Past Surgical History:  Procedure Laterality Date   CESAREAN SECTION MULTI-GESTATIONAL N/A 06/05/2020   Procedure: CESAREAN SECTION MULTI-GESTATIONAL;  Surgeon: Levie Heritage, DO;   Location: [facility] LD ORS;  Service: Obstetrics;  Laterality: N/A;   TONSILLECTOMY     WISDOM TOOTH EXTRACTION       OB History     Gravida  2   Para  1   Term  1   Preterm      AB  1   Living  2      SAB  1   IAB      Ectopic      Multiple  1   Live Births  2           Family History  Problem Relation Age of Onset   Heart murmur Mother    Diabetes Father    Hypertension Father     Social History   Tobacco Use   Smoking status: Never   Smokeless tobacco: Never  Vaping Use   Vaping Use: Never used  Substance Use Topics   Alcohol use: Never   Drug use: Never    Home Medications Prior to Admission medications   Medication Sig Start Date End Date Taking? Authorizing Provider  ferrous sulfate 325 (65 FE) MG tablet Take 1 tablet (325 mg total) by mouth every other day. Patient not taking: Reported on 07/13/2020 06/08/20   Cresenzo-Dishmon, Scarlette Calico, CNM  ibuprofen (ADVIL) 800 MG tablet Take 1 tablet (800 mg total) by mouth every 6 (six) hours. Patient not taking: Reported on 07/13/2020 06/08/20   Cresenzo-Dishmon, Scarlette Calico, CNM  norethindrone (MICRONOR) 0.35 MG tablet Take 1 tablet (0.35 mg total) by mouth daily. 07/13/20   Warden Fillers,  MD    Allergies    Patient has no known allergies.  Review of Systems   Review of Systems  All other systems reviewed and are negative.  Physical Exam Updated Vital Signs BP 107/67 (BP Location: Left Arm)   Pulse 93   Temp 98.6 F (37 C)   Resp 16   Ht 4\' 11"  (1.499 m)   Wt 79.4 kg   LMP 02/06/2021   SpO2 97%   BMI 35.35 kg/m   Physical Exam Vitals and nursing note reviewed.  Constitutional:      General: She is not in acute distress.    Appearance: She is well-developed.  HENT:     Head: Atraumatic.  Eyes:     Conjunctiva/sclera: Conjunctivae normal.  Cardiovascular:     Rate and Rhythm: Normal rate and regular rhythm.     Pulses: Normal pulses.     Heart sounds: Normal heart sounds.  Pulmonary:      Effort: Pulmonary effort is normal.  Abdominal:     Palpations: Abdomen is soft.     Tenderness: There is no abdominal tenderness. There is no right CVA tenderness or left CVA tenderness.  Musculoskeletal:     Cervical back: Neck supple.  Skin:    Findings: No rash.  Neurological:     Mental Status: She is alert. Mental status is at baseline.  Psychiatric:        Mood and Affect: Mood normal.    ED Results / Procedures / Treatments   Labs (all labs ordered are listed, but only abnormal results are displayed) Labs Reviewed  WET PREP, GENITAL - Abnormal; Notable for the following components:      Result Value   Clue Cells Wet Prep HPF POC PRESENT (*)    WBC, Wet Prep HPF POC MANY (*)    All other components within normal limits  BASIC METABOLIC PANEL - Abnormal; Notable for the following components:   Glucose, Bld 119 (*)    All other components within normal limits  URINALYSIS, ROUTINE W REFLEX MICROSCOPIC - Abnormal; Notable for the following components:   APPearance CLOUDY (*)    Hgb urine dipstick SMALL (*)    Ketones, ur 15 (*)    Protein, ur 100 (*)    Leukocytes,Ua LARGE (*)    All other components within normal limits  URINALYSIS, MICROSCOPIC (REFLEX) - Abnormal; Notable for the following components:   Bacteria, UA FEW (*)    Non Squamous Epithelial PRESENT (*)    All other components within normal limits  URINE CULTURE  CBC  RPR  HIV ANTIBODY (ROUTINE TESTING W REFLEX)  I-STAT BETA HCG BLOOD, ED ([facility], [facility], [facility] ONLY)  GC/CHLAMYDIA PROBE AMP ([place]) NOT AT [facility]    EKG None  Radiology No results found.  Procedures Pelvic exam  Date/Time: 03/06/2021 4:47 PM Performed by: 03/08/2021, PA-C Authorized by: Fayrene Helper, PA-C  Consent: Verbal consent obtained. Risks and benefits: risks, benefits and alternatives were discussed Consent given by: patient Patient tolerance: patient tolerated the procedure well with no immediate complications Comments:  Kristen, NT, available to chaperone.  No inguinal adenopathy or inguinal hernia noted.  Normal external genitalia.  No significant discomfort with speculum insertion.  Mild vaginal discharge noted in vaginal vault.  Cervical os closed.  On bimanual examination mild right adnexal tenderness without cervical motion tenderness.     Medications Ordered in ED Medications  ondansetron (ZOFRAN-ODT) disintegrating tablet 4 mg (4 mg Oral Given 03/06/21 0428)  ibuprofen (ADVIL) tablet 800 mg (800 mg Oral Given 03/06/21 1057)  cefTRIAXone (ROCEPHIN) injection 500 mg (500 mg Intramuscular Given 03/06/21 1702)    ED Course  I have reviewed the triage vital signs and the nursing notes.  Pertinent labs & imaging results that were available during my care of the patient were reviewed by me and considered in my medical decision making (see chart for details).    MDM Rules/Calculators/A&P                           BP 107/67 (BP Location: Left Arm)   Pulse 93   Temp 98.6 F (37 C)   Resp 16   Ht 4\' 11"  (1.499 m)   Wt 79.4 kg   LMP 02/06/2021   SpO2 97%   BMI 35.35 kg/m   Final Clinical Impression(s) / ED Diagnoses Final diagnoses:  Cervicitis    Rx / DC Orders ED Discharge Orders          Ordered    doxycycline (VIBRAMYCIN) 100 MG capsule  2 times daily        03/06/21 1744           3:53 PM Patient here with dysuria for the past 3-day.  She was concern for potential UTI.  She also endorsed having vaginal discharge and admits to having new sexual partner.  Patient overall well-appearing, headache without red flags.  Labs are reassuring.  Will check UA, will perform pelvic exam to assess for potential STI.  4:48 PM Pelvic exam without evidence of PID.  She does have some mild adnexal tenderness.  She does have some vaginal discharge.  Patient is amenable for prophylactic antibiotic while she is here.  Her UA is currently pending.  We will give Rocephin.  Patient overall well-appearing  I have very low suspicion for meningitis or any other concerning feature in regards to her headache.  5:42 PM UA with findings suspicious of UTI.  Since patient has evidence of cervicitis as well as UTI, she did receive Rocephin here, will discharge home with doxycycline.  Return precaution given   03/08/21 03/06/21 1746    03/08/21, MD 03/08/21 2328

## 2021-03-06 NOTE — ED Provider Notes (Signed)
Emergency Medicine Provider Triage Evaluation Note  Kathy Harris , a 20 y.o. female  was evaluated in triage.  Pt complains of Pt complains of headache.  The patient reports that she is having a bilateral frontal headache for the last 3 days accompanied by nausea, vomiting, and photophobia. She reports that she developed a sore throat, but only after vomiting.  She states that the headache began abruptly and reached maximal intensity immediately.  She has no history of headaches.  She does report that there is concerned that she could be pregnant as she is sexually active.  Vomiting did not begin until tonight.  She denies back pain, chest pain, shortness of breath, abdominal pain, numbness, weakness, diplopia, blurred vision, vision loss, neck pain or stiffness, fever, or chills.  She has taken Tylenol for her headache without improvement.  Review of Systems  Positive: Vomiting, nausea, headache, photophobia Negative: Back pain chest pain, shortness of breath, abdominal pain, numbness, weakness, diplopia, blurred vision, vision loss, neck pain, neck stiffness, fever, chills  Physical Exam  BP 123/82 (BP Location: Right Arm)   Pulse (!) 105   Temp 99.7 F (37.6 C)   Resp 16   Ht 4\' 11"  (1.499 m)   Wt 79.4 kg   LMP 02/06/2021   SpO2 99%   BMI 35.35 kg/m  Gen:   Awake, no distress   Resp:  Normal effort  MSK:   Moves extremities without difficulty  Other:  Cranial nerves II through XII are grossly intact.  Pupils are equal round and reactive to light.  Good strength against resistance of bilateral upper and lower extremities with sensation intact and equal.  She is closing and covering her eyes in the room.  Appears uncomfortable.  Medical Decision Making  Medically screening exam initiated at 4:29 AM.  Appropriate orders placed.  P[place] Nylund was informed that the remainder of the evaluation will be completed by another provider, this initial triage assessment does not replace that  evaluation, and the importance of remaining in the ED until their evaluation is complete.  20 year old female presenting with headache for 3 days accompanied by nausea, vomiting, and photophobia.  She does express some concern for pregnancy.  Will order basic labs.   26, PA-C 03/06/21 0429    03/08/21, MD 03/06/21 [phone]

## 2021-03-06 NOTE — Discharge Instructions (Signed)
You have evidence of a urinary tract infection.  Please take antibiotic as prescribed.  You may follow-up on the results of the test today through MyChart, link below.  If you test positive, consider having your partner tested and treated as well.  Return if you have any concern.  You may take Tylenol or ibuprofen over-the-counter as needed for headache

## 2021-03-07 LAB — URINE CULTURE

## 2021-03-07 LAB — GC/CHLAMYDIA PROBE AMP (~~LOC~~) NOT AT ARMC
Chlamydia: NEGATIVE
Comment: NEGATIVE
Comment: NORMAL
Neisseria Gonorrhea: NEGATIVE

## 2021-03-07 LAB — RPR: RPR Ser Ql: NONREACTIVE

## 2021-07-03 ENCOUNTER — Encounter: Payer: Medicaid Other | Admitting: Obstetrics

## 2021-07-18 LAB — OB RESULTS CONSOLE ABO/RH: RH Type: POSITIVE

## 2021-07-18 LAB — HEPATITIS C ANTIBODY: HCV Ab: NEGATIVE

## 2021-07-18 LAB — OB RESULTS CONSOLE HEPATITIS B SURFACE ANTIGEN: Hepatitis B Surface Ag: NEGATIVE

## 2021-07-18 LAB — OB RESULTS CONSOLE GC/CHLAMYDIA
Chlamydia: NEGATIVE
Neisseria Gonorrhea: NEGATIVE

## 2021-07-18 LAB — OB RESULTS CONSOLE RUBELLA ANTIBODY, IGM: Rubella: IMMUNE

## 2021-07-18 LAB — OB RESULTS CONSOLE HIV ANTIBODY (ROUTINE TESTING): HIV: NONREACTIVE

## 2021-07-18 LAB — OB RESULTS CONSOLE RPR: RPR: NONREACTIVE

## 2021-07-18 LAB — OB RESULTS CONSOLE ANTIBODY SCREEN: Antibody Screen: NEGATIVE

## 2021-08-06 ENCOUNTER — Encounter: Payer: Medicaid Other | Admitting: Obstetrics

## 2021-12-31 LAB — OB RESULTS CONSOLE GBS: GBS: POSITIVE

## 2022-01-02 ENCOUNTER — Encounter (HOSPITAL_COMMUNITY): Payer: Self-pay | Admitting: Obstetrics and Gynecology

## 2022-01-02 ENCOUNTER — Inpatient Hospital Stay (HOSPITAL_COMMUNITY)
Admission: AD | Admit: 2022-01-02 | Discharge: 2022-01-02 | Disposition: A | Discharge status: Home / Self Care | Payer: BLUE CROSS/BLUE SHIELD | Attending: Obstetrics and Gynecology | Admitting: Obstetrics and Gynecology

## 2022-01-02 ENCOUNTER — Other Ambulatory Visit: Payer: Self-pay

## 2022-01-02 DIAGNOSIS — O471 False labor at or after 37 completed weeks of gestation: Secondary | ICD-10-CM | POA: Insufficient documentation

## 2022-01-02 DIAGNOSIS — O34219 Maternal care for unspecified type scar from previous cesarean delivery: Secondary | ICD-10-CM | POA: Diagnosis not present

## 2022-01-02 DIAGNOSIS — Z3A37 37 weeks gestation of pregnancy: Secondary | ICD-10-CM | POA: Insufficient documentation

## 2022-01-02 DIAGNOSIS — O479 False labor, unspecified: Secondary | ICD-10-CM | POA: Diagnosis not present

## 2022-01-02 NOTE — Progress Notes (Signed)
Pt states ctxs are spacing out now.

## 2022-01-02 NOTE — Discharge Instructions (Signed)
The [employer]  This circuit takes at least 90 minutes to complete so clear your schedule and make mental preparations so you can relax in your environment. The second step requires a lot of pillows so gather them up before beginning Before starting, you should empty your bladder! Have a nice drink nearby, and make sure it has a straw! If you are having contractions, this circuit should be done through contractions, try not to change positions between steps Before you begin...  "I named this 'circuit' after my friend Kathy Harris, who shared and discussed it with me when I was working with a client whose labor seemed to be stalled out and no longer progressing... This circuit is useful to help get the baby lined up, ideally, in the "Left Occiput Anterior" (LOA) Position, both before labor begins and when some corrections need to be done during labor. Prenatally, this position set can help to rotate a baby. As a natural method of induction, this can help get things going if baby just needed a gentle nudge of position to set things off. To the best of my knowledge, this group of positions will not "hurt" a baby that is already lined up correctly." - Kathy Harris   Step One: Open-knee Chest Stay in this position for 30 minutes, start in cat/cow, then drop your chest as low as you can to the bed or the floor and your bottom as high as you can. Knees should be fairly wide apart, and the angle between the torso/thighs should be wider than 90 degrees. Wiggle around, prop with lots of pillows and use this time to get totally relaxed. This position allows the baby to scoot out of the pelvis a bit and gives them room to rotate, shift their head position, etc. If the pregnant person finds it helpful, careful positioning with a rebozo under the belly, with gentle tension from a support person behind can help maintain this position for the full 30 minutes.  Step Two:Exaggerated Left Side  Lying Roll to your left side, bringing your top leg as high as possible and keeping your bottom leg straight. Roll forward as much as possible, again using a lot of pillows. Sink into the bed and relax some more. If you fall asleep, that's totally okay and you can stay there! If not, stay here for at least another half an hour. Try and get your top right leg up towards your head and get as rolled over onto your belly as much as possible. If you repeat the circuit during labor, try alternating left and right sides. We know the photo the left is actually right side... just flip the image in your head.  Step Three: Moving and Lunges Lunge, walk stairs facing sideways, 2 at a time, (have a spotter downstairs of you!), take a walk outside with one foot on the curb and the other on the street, sit on a birth ball and hula- anything that's upright and putting your pelvis in open, asymmetrical positions. Spend at least 30 minutes doing this one as well to give your baby a chance to move down. If you are lunging or stair or curb walking, you should lunge/walk/go up stairs in the direction that feels better to you. The key with the lunge is that the toes of the higher leg and mom's belly button should be at right angles. Do not lunge over your knee, that closes the pelvis.     Kathy Harris: Circuit Creator - [email or web address] Kathy   Harris, CD, BDT (DONA), LCCE, FACCE: Supporting Content - [email or web address] Kathy Harris: Photography - [email or web address] Kate Dewey CD/CDT (BAI): Print and Webmaster - [email or web address] [employer] [email or web address]  

## 2022-01-02 NOTE — MAU Note (Signed)
Kathy Harris is a 21 y.o. at [redacted]w[redacted]d here in MAU reporting: started contracting this afternoon, around 5 they were 3 min apart.  Called dr, was told to come in. No bleeding or leaking. Reports +FM, no recent exams.  Cramping in her legs, started last wk. Onset of complaint: 1400 Pain score: 6 Vitals:   01/02/22 1821  BP: 105/65  Pulse: 100  Resp: 17  Temp: 98.5 F (36.9 C)  SpO2: 100%     FHT:145 Lab orders placed from triage:  none

## 2022-01-02 NOTE — Progress Notes (Signed)
Written and verbal d/c instructions given and understanding voiced. Next appt with Dr Henderson Cloud is 7/25 per pt

## 2022-01-02 NOTE — MAU Provider Note (Signed)
MAU Labor Check   S: Ms. Kathy Harris is a 21 y.o. [phone] at [redacted]w[redacted]d who presents to MAU today complaining contractions since earlier today. She denies vaginal bleeding. She denies LOF. She reports normal fetal movement.  Planning a repeat CS in a few weeks.   O: BP 102/67 (BP Location: Left Arm)   Pulse 100   Temp 98.5 F (36.9 C) (Oral)   Resp 17   Ht 5' (1.524 m)   Wt 103.8 kg   LMP 02/06/2021   SpO2 100%   BMI 44.68 kg/m   Cervical exam:  Dilation: Closed Effacement (%): Thick Station: Ballotable Exam by:: Quintella Baton RNC Unchanged >1 hour (2 checks)    Fetal Monitoring: REACTIVE  Baseline: 140 Variability: moderate Accelerations: 15x15, several  Decelerations: None  Contractions: Irregular    A: SIUP at [redacted]w[redacted]d  False labor Reactive NST  Previous CS   P: Discharge home with MAU precautions.  Given information for miles circuit.    Allayne Stack, DO 01/02/2022 7:56 PM

## 2022-01-08 NOTE — Patient Instructions (Signed)
Kathy Harris  01/08/2022   Your procedure is scheduled on:  01/22/2022  Arrive at 1015 at Entrance C on [address] at [facility]  and [employer]. You are invited to use the FREE valet parking or use the Visitor's parking deck.  Pick up the phone at the desk and dial [phone].  Call this number if you have problems the morning of surgery: [phone]  Remember:   Do not eat food:(After Midnight) Desps de medianoche.  Do not drink clear liquids: (After Midnight) Desps de medianoche.  Take these medicines the morning of surgery with A SIP OF WATER:  none   Do not wear jewelry, make-up or nail polish.  Do not wear lotions, powders, or perfumes. Do not wear deodorant.  Do not shave 48 hours prior to surgery.  Do not bring valuables to the hospital.  [facility] is not   responsible for any belongings or valuables brought to the hospital.  Contacts, dentures or bridgework may not be worn into surgery.  Leave suitcase in the car. After surgery it may be brought to your room.  For patients admitted to the hospital, checkout time is 11:00 AM the day of              discharge.      Please read over the following fact sheets that you were given:     Preparing for Surgery

## 2022-01-09 ENCOUNTER — Telehealth (HOSPITAL_COMMUNITY): Payer: Self-pay | Admitting: *Deleted

## 2022-01-09 ENCOUNTER — Encounter (HOSPITAL_COMMUNITY): Payer: Self-pay

## 2022-01-09 NOTE — Telephone Encounter (Signed)
Preadmission screen  

## 2022-01-10 ENCOUNTER — Telehealth (HOSPITAL_COMMUNITY): Payer: Self-pay | Admitting: *Deleted

## 2022-01-10 NOTE — Telephone Encounter (Signed)
Preadmission screen  

## 2022-01-13 ENCOUNTER — Encounter (HOSPITAL_COMMUNITY): Payer: Self-pay

## 2022-01-20 ENCOUNTER — Other Ambulatory Visit: Payer: Self-pay | Admitting: Obstetrics and Gynecology

## 2022-01-20 ENCOUNTER — Encounter (HOSPITAL_COMMUNITY)
Admission: RE | Admit: 2022-01-20 | Discharge: 2022-01-20 | Disposition: A | Discharge status: Home / Self Care | Payer: Medicaid Other | Source: Ambulatory Visit | Attending: Obstetrics and Gynecology | Admitting: Obstetrics and Gynecology

## 2022-01-20 DIAGNOSIS — Z01812 Encounter for preprocedural laboratory examination: Secondary | ICD-10-CM | POA: Insufficient documentation

## 2022-01-20 DIAGNOSIS — Z3A00 Weeks of gestation of pregnancy not specified: Secondary | ICD-10-CM | POA: Diagnosis not present

## 2022-01-20 DIAGNOSIS — O0990 Supervision of high risk pregnancy, unspecified, unspecified trimester: Secondary | ICD-10-CM

## 2022-01-20 LAB — CBC
HCT: 35 % — ABNORMAL LOW (ref 36.0–46.0)
Hemoglobin: 11 g/dL — ABNORMAL LOW (ref 12.0–15.0)
MCH: 26.6 pg (ref 26.0–34.0)
MCHC: 31.4 g/dL (ref 30.0–36.0)
MCV: 84.5 fL (ref 80.0–100.0)
Platelets: 227 10*3/uL (ref 150–400)
RBC: 4.14 MIL/uL (ref 3.87–5.11)
RDW: 13.5 % (ref 11.5–15.5)
WBC: 6.5 10*3/uL (ref 4.0–10.5)
nRBC: 0 % (ref 0.0–0.2)

## 2022-01-20 LAB — TYPE AND SCREEN
ABO/RH(D): AB POS
Antibody Screen: NEGATIVE

## 2022-01-21 LAB — RPR: RPR Ser Ql: NONREACTIVE

## 2022-01-22 ENCOUNTER — Encounter (HOSPITAL_COMMUNITY)
Admission: AD | Disposition: A | Discharge status: Home / Self Care | Payer: Self-pay | Source: Home / Self Care | Attending: Obstetrics and Gynecology

## 2022-01-22 ENCOUNTER — Other Ambulatory Visit: Payer: Self-pay

## 2022-01-22 ENCOUNTER — Inpatient Hospital Stay (HOSPITAL_COMMUNITY): Payer: BLUE CROSS/BLUE SHIELD | Admitting: Anesthesiology

## 2022-01-22 ENCOUNTER — Encounter (HOSPITAL_COMMUNITY): Payer: Self-pay | Admitting: Obstetrics and Gynecology

## 2022-01-22 ENCOUNTER — Inpatient Hospital Stay (HOSPITAL_COMMUNITY)
Admission: AD | Admit: 2022-01-22 | Discharge: 2022-01-24 | DRG: 788 | Disposition: A | Discharge status: Home / Self Care | Payer: BLUE CROSS/BLUE SHIELD | Attending: Obstetrics and Gynecology | Admitting: Obstetrics and Gynecology

## 2022-01-22 DIAGNOSIS — O99824 Streptococcus B carrier state complicating childbirth: Secondary | ICD-10-CM | POA: Diagnosis present

## 2022-01-22 DIAGNOSIS — D649 Anemia, unspecified: Secondary | ICD-10-CM

## 2022-01-22 DIAGNOSIS — Z3A40 40 weeks gestation of pregnancy: Secondary | ICD-10-CM

## 2022-01-22 DIAGNOSIS — O99214 Obesity complicating childbirth: Secondary | ICD-10-CM | POA: Diagnosis present

## 2022-01-22 DIAGNOSIS — O34211 Maternal care for low transverse scar from previous cesarean delivery: Secondary | ICD-10-CM | POA: Diagnosis present

## 2022-01-22 DIAGNOSIS — Z3490 Encounter for supervision of normal pregnancy, unspecified, unspecified trimester: Principal | ICD-10-CM

## 2022-01-22 DIAGNOSIS — O9902 Anemia complicating childbirth: Secondary | ICD-10-CM

## 2022-01-22 HISTORY — DX: Encounter for supervision of normal pregnancy, unspecified, unspecified trimester: Z34.90

## 2022-01-22 SURGERY — Surgical Case
Anesthesia: Spinal | Site: Abdomen | Wound class: Clean Contaminated

## 2022-01-22 MED ORDER — OXYTOCIN-SODIUM CHLORIDE 30-0.9 UT/500ML-% IV SOLN
INTRAVENOUS | Status: AC
  Filled 2022-01-22: qty 500

## 2022-01-22 MED ORDER — WITCH HAZEL-GLYCERIN EX PADS: 1.0000 | MEDICATED_PAD | CUTANEOUS | Status: DC | PRN

## 2022-01-22 MED ORDER — OXYTOCIN-SODIUM CHLORIDE 30-0.9 UT/500ML-% IV SOLN
2.5000 [IU]/h | INTRAVENOUS | Status: AC
  Administered 2022-01-22: 2.5 [IU]/h via INTRAVENOUS

## 2022-01-22 MED ORDER — CEFAZOLIN SODIUM-DEXTROSE 2-4 GM/100ML-% IV SOLN
INTRAVENOUS | Status: AC
  Filled 2022-01-22: qty 100

## 2022-01-22 MED ORDER — ZOLPIDEM TARTRATE 5 MG PO TABS: 5.0000 mg | ORAL_TABLET | Freq: Every evening | ORAL | Status: DC | PRN

## 2022-01-22 MED ORDER — OXYCODONE HCL 5 MG PO TABS: 5.0000 mg | ORAL_TABLET | Freq: Once | ORAL | Status: DC | PRN

## 2022-01-22 MED ORDER — KETOROLAC TROMETHAMINE 30 MG/ML IJ SOLN
30.0000 mg | Freq: Once | INTRAMUSCULAR | Status: AC
  Administered 2022-01-22: 30 mg via INTRAVENOUS

## 2022-01-22 MED ORDER — MENTHOL 3 MG MT LOZG: 1.0000 | LOZENGE | OROMUCOSAL | Status: DC | PRN

## 2022-01-22 MED ORDER — BUPIVACAINE IN DEXTROSE 0.75-8.25 % IT SOLN
INTRATHECAL | Status: DC | PRN
  Administered 2022-01-22: 1.6 mL via INTRATHECAL

## 2022-01-22 MED ORDER — ONDANSETRON HCL 4 MG/2ML IJ SOLN
INTRAMUSCULAR | Status: AC
  Filled 2022-01-22: qty 2

## 2022-01-22 MED ORDER — PROMETHAZINE HCL 25 MG/ML IJ SOLN
6.2500 mg | INTRAMUSCULAR | Status: DC | PRN
  Administered 2022-01-22: 6.25 mg via INTRAVENOUS

## 2022-01-22 MED ORDER — IBUPROFEN 600 MG PO TABS
600.0000 mg | ORAL_TABLET | Freq: Four times a day (QID) | ORAL | Status: DC
  Administered 2022-01-23 – 2022-01-24 (×4): 600 mg via ORAL
  Filled 2022-01-22 (×6): qty 1

## 2022-01-22 MED ORDER — TETANUS-DIPHTH-ACELL PERTUSSIS 5-2.5-18.5 LF-MCG/0.5 IM SUSY: 0.5000 mL | PREFILLED_SYRINGE | Freq: Once | INTRAMUSCULAR | Status: DC

## 2022-01-22 MED ORDER — SIMETHICONE 80 MG PO CHEW
80.0000 mg | CHEWABLE_TABLET | ORAL | Status: DC | PRN
  Administered 2022-01-24: 80 mg via ORAL

## 2022-01-22 MED ORDER — OXYCODONE HCL 5 MG/5ML PO SOLN: 5.0000 mg | Freq: Once | ORAL | Status: DC | PRN

## 2022-01-22 MED ORDER — PHENYLEPHRINE HCL-NACL 20-0.9 MG/250ML-% IV SOLN
INTRAVENOUS | Status: DC | PRN
  Administered 2022-01-22: 60 ug/min via INTRAVENOUS

## 2022-01-22 MED ORDER — MORPHINE SULFATE (PF) 0.5 MG/ML IJ SOLN
INTRAMUSCULAR | Status: AC
  Filled 2022-01-22: qty 10

## 2022-01-22 MED ORDER — SODIUM CHLORIDE 0.9 % IR SOLN
Status: DC | PRN
  Administered 2022-01-22: 1000 mL

## 2022-01-22 MED ORDER — METHYLERGONOVINE MALEATE 0.2 MG/ML IJ SOLN: 0.2000 mg | INTRAMUSCULAR | Status: DC | PRN

## 2022-01-22 MED ORDER — FENTANYL CITRATE (PF) 100 MCG/2ML IJ SOLN
INTRAMUSCULAR | Status: AC
  Filled 2022-01-22: qty 2

## 2022-01-22 MED ORDER — MORPHINE SULFATE (PF) 0.5 MG/ML IJ SOLN
INTRAMUSCULAR | Status: DC | PRN
  Administered 2022-01-22: 150 ug via INTRATHECAL

## 2022-01-22 MED ORDER — KETOROLAC TROMETHAMINE 30 MG/ML IJ SOLN
INTRAMUSCULAR | Status: AC
  Filled 2022-01-22: qty 1

## 2022-01-22 MED ORDER — ACETAMINOPHEN 325 MG PO TABS
650.0000 mg | ORAL_TABLET | ORAL | Status: DC | PRN
  Administered 2022-01-23 – 2022-01-24 (×2): 650 mg via ORAL
  Filled 2022-01-22 (×2): qty 2

## 2022-01-22 MED ORDER — OXYCODONE HCL 5 MG PO TABS
5.0000 mg | ORAL_TABLET | ORAL | Status: DC | PRN
  Administered 2022-01-24: 5 mg via ORAL
  Filled 2022-01-22: qty 1

## 2022-01-22 MED ORDER — ACETAMINOPHEN 10 MG/ML IV SOLN
1000.0000 mg | Freq: Once | INTRAVENOUS | Status: DC | PRN
  Administered 2022-01-22: 1000 mg via INTRAVENOUS

## 2022-01-22 MED ORDER — ACETAMINOPHEN 10 MG/ML IV SOLN
INTRAVENOUS | Status: AC
  Filled 2022-01-22: qty 100

## 2022-01-22 MED ORDER — POVIDONE-IODINE 10 % EX SWAB
2.0000 | Freq: Once | CUTANEOUS | Status: AC
  Administered 2022-01-22: 2 via TOPICAL

## 2022-01-22 MED ORDER — PHENYLEPHRINE HCL-NACL 20-0.9 MG/250ML-% IV SOLN
INTRAVENOUS | Status: AC
  Filled 2022-01-22: qty 250

## 2022-01-22 MED ORDER — CEFAZOLIN SODIUM-DEXTROSE 2-4 GM/100ML-% IV SOLN
2.0000 g | INTRAVENOUS | Status: AC
  Administered 2022-01-22: 2 g via INTRAVENOUS

## 2022-01-22 MED ORDER — COCONUT OIL OIL: 1.0000 | TOPICAL_OIL | Status: DC | PRN

## 2022-01-22 MED ORDER — ONDANSETRON HCL 4 MG/2ML IJ SOLN
INTRAMUSCULAR | Status: DC | PRN
  Administered 2022-01-22: 4 mg via INTRAVENOUS

## 2022-01-22 MED ORDER — DIBUCAINE (PERIANAL) 1 % EX OINT: 1.0000 | TOPICAL_OINTMENT | CUTANEOUS | Status: DC | PRN

## 2022-01-22 MED ORDER — FENTANYL CITRATE (PF) 100 MCG/2ML IJ SOLN
INTRAMUSCULAR | Status: DC | PRN
  Administered 2022-01-22: 15 ug via INTRATHECAL

## 2022-01-22 MED ORDER — METHYLERGONOVINE MALEATE 0.2 MG PO TABS: 0.2000 mg | ORAL_TABLET | ORAL | Status: DC | PRN

## 2022-01-22 MED ORDER — PROMETHAZINE HCL 25 MG/ML IJ SOLN
INTRAMUSCULAR | Status: AC
  Filled 2022-01-22: qty 1

## 2022-01-22 MED ORDER — DEXAMETHASONE SODIUM PHOSPHATE 4 MG/ML IJ SOLN
INTRAMUSCULAR | Status: AC
  Filled 2022-01-22: qty 1

## 2022-01-22 MED ORDER — MEPERIDINE HCL 25 MG/ML IJ SOLN: 6.2500 mg | INTRAMUSCULAR | Status: DC | PRN

## 2022-01-22 MED ORDER — SENNOSIDES-DOCUSATE SODIUM 8.6-50 MG PO TABS
2.0000 | ORAL_TABLET | Freq: Every day | ORAL | Status: DC
  Administered 2022-01-23 – 2022-01-24 (×2): 2 via ORAL
  Filled 2022-01-22 (×2): qty 2

## 2022-01-22 MED ORDER — DIPHENHYDRAMINE HCL 25 MG PO CAPS: 25.0000 mg | ORAL_CAPSULE | Freq: Four times a day (QID) | ORAL | Status: DC | PRN

## 2022-01-22 MED ORDER — DEXAMETHASONE SODIUM PHOSPHATE 10 MG/ML IJ SOLN
INTRAMUSCULAR | Status: DC | PRN
  Administered 2022-01-22: 4 mg via INTRAVENOUS

## 2022-01-22 MED ORDER — LACTATED RINGERS IV SOLN: INTRAVENOUS | Status: DC

## 2022-01-22 MED ORDER — PRENATAL MULTIVITAMIN CH
1.0000 | ORAL_TABLET | Freq: Every day | ORAL | Status: DC
Start: 2022-01-23 — End: 2022-01-24
  Administered 2022-01-23 – 2022-01-24 (×2): 1 via ORAL
  Filled 2022-01-22 (×2): qty 1

## 2022-01-22 MED ORDER — HYDROMORPHONE HCL 1 MG/ML IJ SOLN: 0.2500 mg | INTRAMUSCULAR | Status: DC | PRN

## 2022-01-22 MED ORDER — OXYTOCIN-SODIUM CHLORIDE 30-0.9 UT/500ML-% IV SOLN
INTRAVENOUS | Status: DC | PRN
  Administered 2022-01-22: 300 mL via INTRAVENOUS

## 2022-01-22 MED ORDER — SIMETHICONE 80 MG PO CHEW
80.0000 mg | CHEWABLE_TABLET | Freq: Three times a day (TID) | ORAL | Status: DC
  Administered 2022-01-23 (×2): 80 mg via ORAL
  Filled 2022-01-22 (×3): qty 1

## 2022-01-22 SURGICAL SUPPLY — 33 items
CHLORAPREP W/TINT 26ML (MISCELLANEOUS) ×4 IMPLANT
CLAMP CORD UMBIL (MISCELLANEOUS) ×2 IMPLANT
CLIP FILSHIE TUBAL LIGA STRL (Clip) IMPLANT
CLOTH BEACON ORANGE TIMEOUT ST (SAFETY) ×2 IMPLANT
DERMABOND ADVANCED (GAUZE/BANDAGES/DRESSINGS) ×2
DERMABOND ADVANCED .7 DNX12 (GAUZE/BANDAGES/DRESSINGS) IMPLANT
DRSG OPSITE POSTOP 4X10 (GAUZE/BANDAGES/DRESSINGS) ×2 IMPLANT
ELECT REM PT RETURN 9FT ADLT (ELECTROSURGICAL) ×2
ELECTRODE REM PT RTRN 9FT ADLT (ELECTROSURGICAL) ×1 IMPLANT
EXTRACTOR VACUUM M CUP 4 TUBE (SUCTIONS) IMPLANT
GLOVE BIO SURGEON STRL SZ7 (GLOVE) ×2 IMPLANT
GLOVE BIOGEL PI IND STRL 7.0 (GLOVE) ×1 IMPLANT
GLOVE BIOGEL PI INDICATOR 7.0 (GLOVE) ×2
GOWN STRL REUS W/TWL LRG LVL3 (GOWN DISPOSABLE) ×4 IMPLANT
KIT ABG SYR 3ML LUER SLIP (SYRINGE) IMPLANT
NDL HYPO 25X5/8 SAFETYGLIDE (NEEDLE) IMPLANT
NEEDLE HYPO 22GX1.5 SAFETY (NEEDLE) IMPLANT
NEEDLE HYPO 25X5/8 SAFETYGLIDE (NEEDLE) IMPLANT
NS IRRIG 1000ML POUR BTL (IV SOLUTION) ×2 IMPLANT
PACK C SECTION WH (CUSTOM PROCEDURE TRAY) ×2 IMPLANT
PAD OB MATERNITY 4.3X12.25 (PERSONAL CARE ITEMS) ×2 IMPLANT
RTRCTR C-SECT PINK 25CM LRG (MISCELLANEOUS) ×2 IMPLANT
SUT CHROMIC 1 CTX 36 (SUTURE) ×4 IMPLANT
SUT CHROMIC 2 0 CT 1 (SUTURE) ×2 IMPLANT
SUT PDS AB 0 CTX 60 (SUTURE) ×2 IMPLANT
SUT PLAIN 2 0 XLH (SUTURE) ×1 IMPLANT
SUT VIC AB 2-0 CT1 27 (SUTURE) ×2
SUT VIC AB 2-0 CT1 TAPERPNT 27 (SUTURE) ×1 IMPLANT
SUT VIC AB 4-0 KS 27 (SUTURE) IMPLANT
SYR 30ML LL (SYRINGE) ×1 IMPLANT
TOWEL OR 17X24 6PK STRL BLUE (TOWEL DISPOSABLE) ×2 IMPLANT
TRAY FOLEY W/BAG SLVR 14FR LF (SET/KITS/TRAYS/PACK) ×2 IMPLANT
WATER STERILE IRR 1000ML POUR (IV SOLUTION) ×2 IMPLANT

## 2022-01-22 NOTE — Anesthesia Postprocedure Evaluation (Signed)
Anesthesia Post Note  Patient: Kathy Harris  Procedure(s) Performed: CESAREAN SECTION (Abdomen)     Patient location during evaluation: PACU Anesthesia Type: Spinal Level of consciousness: oriented and awake and alert Pain management: pain level controlled Vital Signs Assessment: post-procedure vital signs reviewed and stable Respiratory status: spontaneous breathing, respiratory function stable and nonlabored ventilation Cardiovascular status: blood pressure returned to baseline and stable Postop Assessment: no headache, no backache, no apparent nausea or vomiting and spinal receding Anesthetic complications: no   No notable events documented.  Last Vitals:  Vitals:   01/22/22 1502 01/22/22 1615  BP: 105/72 (!) 88/46  Pulse: (!) 58 (!) 56  Resp: 16 18  Temp: (!) 36.4 C (!) 35.8 C  SpO2: 95% 97%    Last Pain:  Vitals:   01/22/22 1615  TempSrc: Axillary  PainSc: 2                  Lucretia Kern

## 2022-01-22 NOTE — H&P (Addendum)
Kathy Harris is a 21 y.o. female presenting for Repeat cesarean section  21 yo [phone] @ 40+0 presents for scheduled repeat cesarean section. Pt had a c/s for last delivery for twins with malpresentation. Her pregnancy has been otherwise uncomplicated OB History     Gravida  3   Para  1   Term  1   Preterm      AB  1   Living  2      SAB  1   IAB      Ectopic      Multiple  1   Live Births  2          Past Medical History:  Diagnosis Date   Anemia    [employer]reesia 05/31/2020   Medical history non-contributory    Past Surgical History:  Procedure Laterality Date   CESAREAN SECTION     CESAREAN SECTION MULTI-GESTATIONAL N/A 06/05/2020   Procedure: CESAREAN SECTION MULTI-GESTATIONAL;  Surgeon: Levie Heritage, DO;  Location: [facility] LD ORS;  Service: Obstetrics;  Laterality: N/A;   TONSILLECTOMY     WISDOM TOOTH EXTRACTION     Family History: family history includes Diabetes in her father; Heart murmur in her mother; Hypertension in her father. Social History:  reports that she has never smoked. She has never used smokeless tobacco. She reports that she does not drink alcohol and does not use drugs.     Maternal Diabetes: No Genetic Screening: Normal Maternal Ultrasounds/Referrals: Normal Fetal Ultrasounds or other Referrals:  None Maternal Substance Abuse:  No Significant Maternal Medications:  None Significant Maternal Lab Results:  Group B Strep positive Number of Prenatal Visits:greater than 3 verified prenatal visits Other Comments:  None  Review of Systems History   Blood pressure 100/77, pulse 81, temperature 98.5 F (36.9 C), temperature source Oral, resp. rate 20, height 5' (1.524 m), weight 101.2 kg, last menstrual period 02/06/2021, SpO2 97 %, not currently breastfeeding. Exam Physical Exam  AOx3, NAD Abd soft Prenatal labs: ABO, Rh: --/--/AB POS (08/07 1010) Antibody: NEG (08/07 1010) Rubella: Immune (02/02 0000) RPR: NON REACTIVE (08/07  1010)  HBsAg: Negative (02/02 0000)  HIV: Non-reactive (02/02 0000)  GBS: Positive/-- (07/18 0000)   Assessment/Plan: 1) Admit 2) Ancef 2 gm 3) SCDs 4) Informed consent obtained, proceed with cesarean   Waynard Reeds 01/22/2022, 11:19 AM

## 2022-01-22 NOTE — Op Note (Signed)
Operative Report   Pre-Operative Diagnosis: 1) 40 week intrauterine pregnancy 2) Desired Repeat  Postoperative Diagnosis: 1) 40 week intrauterine pregnancy 2) Desired Repeat  Procedure: Repeat low transverse cesarean section  Surgeon: Dr. Waynard Reeds  Assistant: Dr. Philip Aspen  Antibiotics: 2gm Ancef  Operative Findings: Vigorous female infant in the vertex presentation. Apgars of 9 at 1 minute and 9 at 5 minutes. Normal ovaries and tubes.  Specimen: Placenta for disposal  EBL: Total I/O In: 2100 [I.V.:2000; IV Piggyback:100] Out: 700 [Urine:100; Blood:600]   Procedure:Ms. Kathy Harris is an 21 year old gravida 3 para 1012 at 40 weeks and 0 days estimated gestational age who presents for cesarean section. Following the appropriate informed consent the patient was brought to the operating room where spinal anesthesia was administered and found to be adequate. She was placed in the dorsal supine position with a leftward tilt. She was prepped and draped in the normal sterile fashion.  The patient was appropriately identified during a preoperative timeout procedure.  The scalpel was then used to make a Pfannenstiel skin incision which was carried down to the underlying layers of soft tissue to the fascia. The fascia was incised in the midline and the fascial incision was extended laterally with Mayo scissors. The superior aspect of the fascial incision was grasped with Coker clamps x2, tented up and the rectus muscles dissected off sharply with the electrocautery unit. The same procedure was repeated on the inferior aspect of the fascial incision. The rectus muscles were separated in the midline. The abdominal peritoneum was identified, tented up, entered sharply, and the incision was extended superiorly and inferiorly with good visualization of the bladder. The Alexis retractor was then deployed. The vesicouterine peritoneum was identified, tented up, entered sharply, and the bladder flap was  created digitally.  The scalpel was then used to make a low transverse incision on the uterus which was extended laterally with blunt dissection. The fetal vertex was identified, delivered easily through the uterine incision followed by the body. The infant was bulb suctioned on the operative field and cried vigorously.  Following a 1 minute delay, the cord was clamped and cut. The infant was passed to the waiting neonatology team. Placenta was then delivered spontaneously and the uterus was cleared of all clot and debris. The uterine incision was repaired with #1 chromic in running locked fashion. The ovaries and tubes were inspected and normal. The Alexis retractor was removed. The abdominal peritoneum was reapproximated with 2-0 Vicryl in a running fashion. The rectus muscles was reapproximated with 2-0 chromic in a running fashion. The fascia was closed with 0-looped PDS in a running fashion. The skin was closed with 4-0 vicryl in a subcuticular fashion and Dermabond. All laparotomy sponge, instrument, and needle counts were correct.  The patient tolerated the procedure well and was transferred to the recovery unit in stable condition following the procedure.

## 2022-01-22 NOTE — Anesthesia Preprocedure Evaluation (Signed)
Anesthesia Evaluation  Patient identified by MRN, date of birth, ID band Patient awake    Reviewed: Allergy & Precautions, H&P , NPO status , Patient's Chart, lab work & pertinent test results  History of Anesthesia Complications Negative for: history of anesthetic complications  Airway Mallampati: II  TM Distance: >3 FB     Dental   Pulmonary neg pulmonary ROS,    Pulmonary exam normal        Cardiovascular negative cardio ROS   Rhythm:regular Rate:Normal     Neuro/Psych negative neurological ROS  negative psych ROS   GI/Hepatic negative GI ROS, Neg liver ROS,   Endo/Other  Morbid obesity  Renal/GU negative Renal ROS  negative genitourinary   Musculoskeletal   Abdominal   Peds  Hematology  (+) Blood dyscrasia, anemia ,   Anesthesia Other Findings   Reproductive/Obstetrics (+) Pregnancy [id number] at [redacted]w[redacted]d                             Anesthesia Physical Anesthesia Plan  ASA: 2  Anesthesia Plan: Spinal   Post-op Pain Management:    Induction:   PONV Risk Score and Plan: Ondansetron and Treatment may vary due to age or medical condition  Airway Management Planned:   Additional Equipment:   Intra-op Plan:   Post-operative Plan:   Informed Consent: I have reviewed the patients History and Physical, chart, labs and discussed the procedure including the risks, benefits and alternatives for the proposed anesthesia with the patient or authorized representative who has indicated his/her understanding and acceptance.       Plan Discussed with: Anesthesiologist  Anesthesia Plan Comments:         Anesthesia Quick Evaluation

## 2022-01-22 NOTE — Transfer of Care (Signed)
Immediate Anesthesia Transfer of Care Note  Patient: Kathy Harris  Procedure(s) Performed: CESAREAN SECTION (Abdomen)  Patient Location: PACU  Anesthesia Type:Spinal  Level of Consciousness: awake  Airway & Oxygen Therapy: Patient Spontanous Breathing  Post-op Assessment: Report given to RN and Post -op Vital signs reviewed and stable  Post vital signs: Reviewed and stable  Last Vitals:  Vitals Value Taken Time  BP 94/59 01/22/22 1330  Temp 36.2 C 01/22/22 1330  Pulse 73 01/22/22 1340  Resp 26 01/22/22 1340  SpO2 94 % 01/22/22 1340  Vitals shown include unvalidated device data.  Last Pain:  Vitals:   01/22/22 1330  TempSrc: Axillary  PainSc:       Patients Stated Pain Goal: 7 (01/22/22 1041)  Complications: No notable events documented.

## 2022-01-22 NOTE — Anesthesia Procedure Notes (Signed)
Spinal  Patient location during procedure: OR Reason for block: surgical anesthesia Staffing Performed: anesthesiologist  Anesthesiologist: Zyniah Ferraiolo E, MD Performed by: Ameera Tigue E, MD Authorized by: Sagan Wurzel E, MD   Preanesthetic Checklist Completed: patient identified, IV checked, risks and benefits discussed, surgical consent, monitors and equipment checked, pre-op evaluation and timeout performed Spinal Block Patient position: sitting Prep: DuraPrep and site prepped and draped Patient monitoring: continuous pulse ox, blood pressure and heart rate Approach: midline Location: L3-4 Injection technique: single-shot Needle Needle type: Pencan  Needle gauge: 24 G Needle length: 10 cm Assessment Events: CSF return Additional Notes Functioning IV was confirmed and monitors were applied. Sterile prep and drape, including hand hygiene and sterile gloves were used. The patient was positioned and the spine was prepped. The skin was anesthetized with lidocaine.  Free flow of clear CSF was obtained prior to injecting local anesthetic into the CSF. The needle was carefully withdrawn. The patient tolerated the procedure well.      

## 2022-01-23 LAB — CBC
HCT: 29.4 % — ABNORMAL LOW (ref 36.0–46.0)
Hemoglobin: 9.3 g/dL — ABNORMAL LOW (ref 12.0–15.0)
MCH: 26.5 pg (ref 26.0–34.0)
MCHC: 31.6 g/dL (ref 30.0–36.0)
MCV: 83.8 fL (ref 80.0–100.0)
Platelets: 195 10*3/uL (ref 150–400)
RBC: 3.51 MIL/uL — ABNORMAL LOW (ref 3.87–5.11)
RDW: 13.4 % (ref 11.5–15.5)
WBC: 11.5 10*3/uL — ABNORMAL HIGH (ref 4.0–10.5)
nRBC: 0 % (ref 0.0–0.2)

## 2022-01-23 LAB — BIRTH TISSUE RECOVERY COLLECTION (PLACENTA DONATION)

## 2022-01-23 MED ORDER — LACTATED RINGERS IV SOLN: INTRAVENOUS | Status: DC

## 2022-01-23 NOTE — Progress Notes (Signed)
Subjective: Postpartum Day 1: Cesarean Delivery Patient reports incisional pain, tolerating PO, + flatus, + BM, and no problems voiding.  She reports pain is well controlled with meds. She denies HA, fever or chills. Lochia mild. Bonding well with baby. No complaints.   Objective: Vital signs in last 24 hours: Temp:  [96.4 F (35.8 C)-97.9 F (36.6 C)] 97.8 F (36.6 C) (08/10 0800) Pulse Rate:  [56-102] 75 (08/10 0800) Resp:  [15-25] 18 (08/10 0800) BP: (86-110)/(46-84) 98/59 (08/10 0800) SpO2:  [91 %-99 %] 98 % (08/10 0800)  Physical Exam:  General: alert, cooperative, and no distress Lochia: appropriate Uterine Fundus: firm Incision: no significant drainage DVT Evaluation: No evidence of DVT seen on physical exam.  Recent Labs    01/23/22 0519  HGB 9.3*  HCT 29.4*    Assessment/Plan: Status post Cesarean section. Doing well postoperatively.  Continue current care. Likely discharge to home tomorrow  Cathrine Muster, DO 01/23/2022, 1:20 PM

## 2022-01-24 MED ORDER — IBUPROFEN 600 MG PO TABS: 600.0000 mg | ORAL_TABLET | Freq: Four times a day (QID) | ORAL | 0 refills | Status: DC | PRN

## 2022-01-24 MED ORDER — OXYCODONE HCL 5 MG PO TABS: 5.0000 mg | ORAL_TABLET | ORAL | 0 refills | Status: DC | PRN

## 2022-01-24 NOTE — Discharge Summary (Signed)
Postpartum Discharge Summary       Patient Name: Kathy Harris DOB: 08-07-2000 MRN: [id number]  Date of admission: 01/22/2022 Delivery date:01/22/2022  Delivering provider: Waynard Reeds  Date of discharge: 01/24/2022  Admitting diagnosis: Term pregnancy [Z34.90] Cesarean delivery delivered [O82] Intrauterine pregnancy: [redacted]w[redacted]d     Secondary diagnosis:  Principal Problem:   Term pregnancy Active Problems:   Cesarean delivery delivered     Discharge diagnosis: Term Pregnancy Delivered                                              Post partum procedures: NA Augmentation: N/A Complications: None  Hospital course: Sceduled C/S   21 y.o. yo G3P2013 at [redacted]w[redacted]d was admitted to the hospital 01/22/2022 for scheduled cesarean section with the following indication:Elective Repeat.Delivery details are as follows:  Membrane Rupture Time/Date: 12:42 PM ,01/22/2022   Delivery Method:C-Section, Low Transverse  Details of operation can be found in separate operative note.  Patient had an uncomplicated postpartum course.  She is ambulating, tolerating a regular diet, passing flatus, and urinating well. Patient is discharged home in stable condition on  01/24/22        Newborn Data: Birth date:01/22/2022  Birth time:12:43 PM  Gender:Female  Living status:Living  Apgars:9 ,9  Weight:3830 g     Magnesium Sulfate received: No BMZ received: No Rhophylac:N/A   Physical exam  Vitals:   01/22/22 1919 01/23/22 0800 01/23/22 1924 01/24/22 0524  BP: (!) 96/54 (!) 98/59 (!) 98/53 108/72  Pulse: 80 75 71 75  Resp: 16 18 18 16   Temp: 97.8 F (36.6 C) 97.8 F (36.6 C) 97.8 F (36.6 C) 98.5 F (36.9 C)  TempSrc: Axillary Oral Oral Oral  SpO2:  98%    Weight:      Height:       General: alert, cooperative, and no distress Lochia: appropriate Uterine Fundus: firm Incision: Healing well with no significant drainage DVT Evaluation: No evidence of DVT seen on physical exam. Labs: Lab Results   Component Value Date   WBC 11.5 (H) 01/23/2022   HGB 9.3 (L) 01/23/2022   HCT 29.4 (L) 01/23/2022   MCV 83.8 01/23/2022   PLT 195 01/23/2022      Latest Ref Rng & Units 03/06/2021    4:31 AM  CMP  Glucose 70 - 99 mg/dL 03/08/2021   BUN 6 - 20 mg/dL 7   Creatinine 993 - 7.16 mg/dL 9.67   Sodium 8.93 - 810 mmol/L 136   Potassium 3.5 - 5.1 mmol/L 4.2   Chloride 98 - 111 mmol/L 103   CO2 22 - 32 mmol/L 22   Calcium 8.9 - 10.3 mg/dL 9.2    Edinburgh Score:    01/22/2022    5:22 PM  Edinburgh Postnatal Depression Scale Screening Tool  I have been able to laugh and see the funny side of things. 0  I have looked forward with enjoyment to things. 1  I have blamed myself unnecessarily when things went wrong. 2  I have been anxious or worried for no good reason. 1  I have felt scared or panicky for no good reason. 2  Things have been getting on top of me. 0  I have been so unhappy that I have had difficulty sleeping. 0  I have felt sad or miserable. 0  I have been so  unhappy that I have been crying. 0  The thought of harming myself has occurred to me. 0  Edinburgh Postnatal Depression Scale Total 6      After visit meds:  Allergies as of 01/24/2022   No Known Allergies      Medication List     STOP taking these medications    prenatal multivitamin Tabs tablet       TAKE these medications    ibuprofen 600 MG tablet Commonly known as: ADVIL Take 1 tablet (600 mg total) by mouth every 6 (six) hours as needed.   oxyCODONE 5 MG immediate release tablet Commonly known as: Roxicodone Take 1 tablet (5 mg total) by mouth every 4 (four) hours as needed for severe pain.               Discharge Care Instructions  (From admission, onward)           Start     Ordered   01/24/22 0000  Discharge wound care:       Comments: For a cesarean delivery: You may wash incision with soap and water.  Do not soak or submerge the incision for 2 weeks. Keep incision dry. You may  need to keep a sanitary pad or panty liner between the incision and your clothing for comfort and to keep the incision dry. If you note drainage, increased pain, or increased redness of the incision, then please notify your physician.   01/24/22 1056   01/24/22 0000  If the dressing is still on your incision site when you go home, remove it on the third day after your surgery date. Remove dressing if it begins to fall off, or if it is dirty or damaged before the third day.       Comments: For a cesarean delivery   01/24/22 1056             Discharge home in stable condition Infant Feeding: Breast Infant Disposition:home with mother Discharge instruction: per After Visit Summary and Postpartum booklet. Activity: Advance as tolerated. Pelvic rest for 6 weeks.  Diet: routine diet Anticipated Birth Control: Unsure Postpartum Appointment:4 weeks Future Appointments:No future appointments. Follow up Visit:  Follow-up Information     Waynard Reeds, MD Follow up in 4 week(s).   Specialty: Obstetrics and Gynecology Why: For a postpartum evaluation Contact information: [address] [place] [address] [place] [address] [phone]                     01/24/2022 Waynard Reeds, MD

## 2022-02-01 ENCOUNTER — Telehealth (HOSPITAL_COMMUNITY): Payer: Self-pay | Admitting: *Deleted

## 2022-02-01 NOTE — Telephone Encounter (Signed)
Attempted Hospital Discharge Follow-Up Call.  Left voice mail requesting that patient return RN's phone call if patient has any concerns or questions regarding herself or her baby.  

## 2022-05-24 IMAGING — US US MFM OB DETAIL EACH ADDL GEST+14 WK
1 series · 14 of 28 positions shown · non-contrast
Comparison: none

[Series 1: us mfm ob detail each addl gest+14 wk · 14 of 160 slices shown]
[im 6/160]
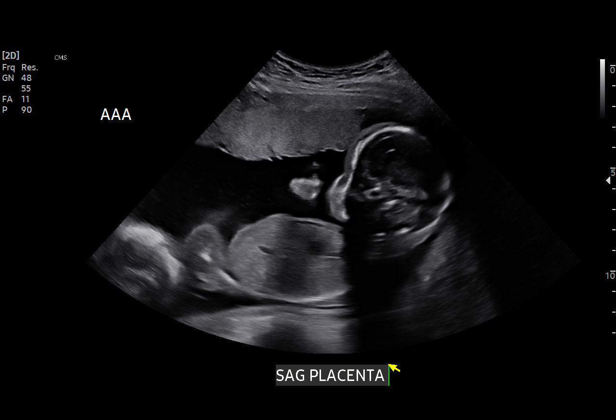
[im 18/160]
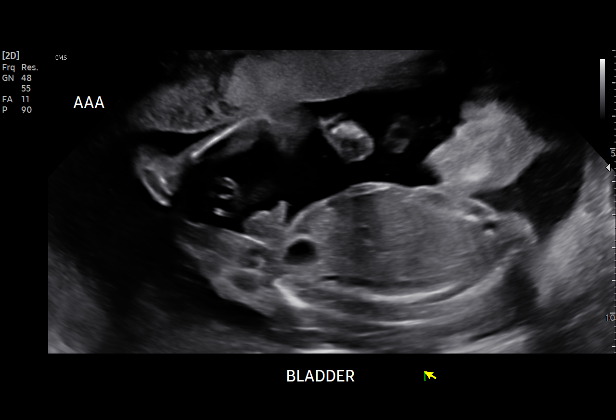
[im 30/160]
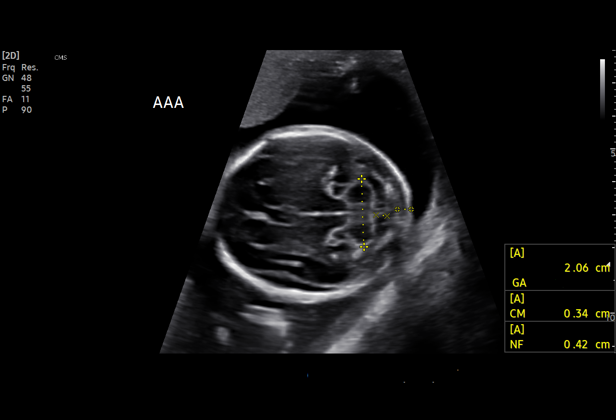
[im 42/160]
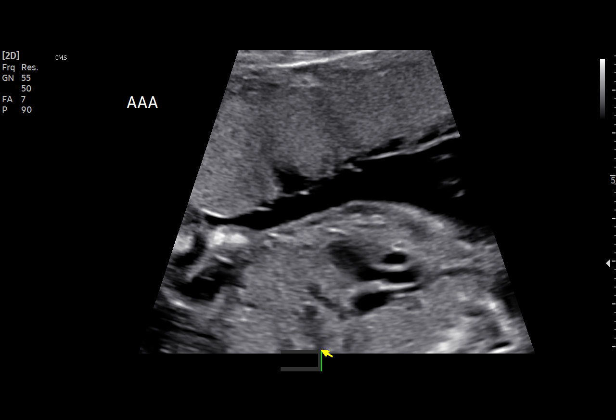
[im 54/160]
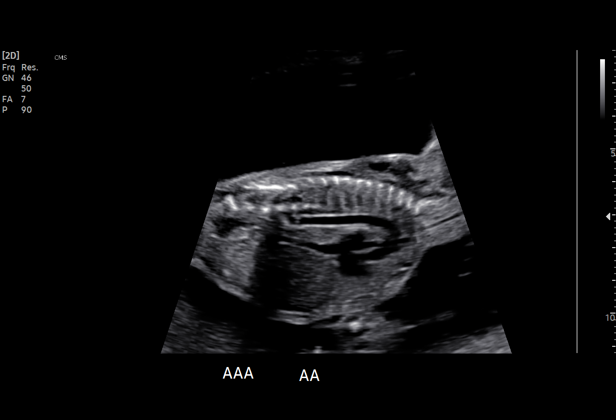
[im 65/160]
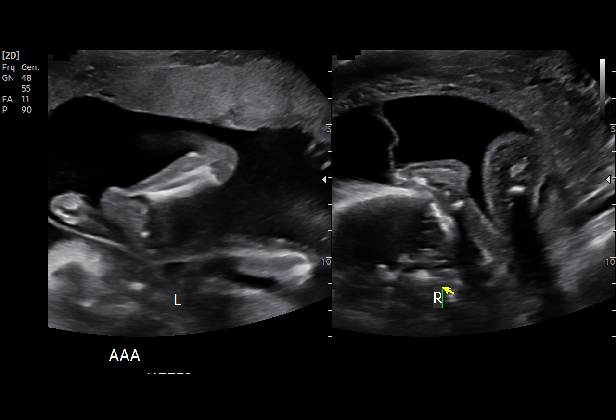
[im 77/160]
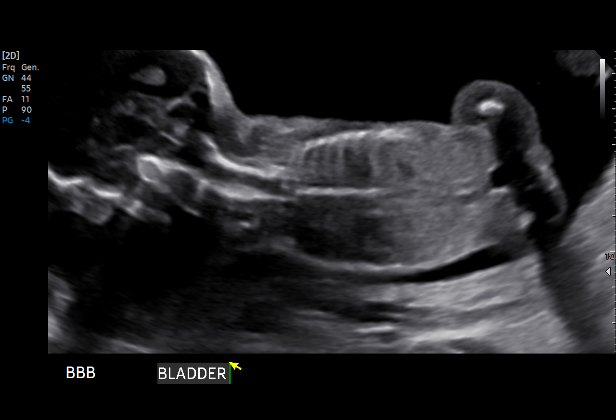
[im 89/160]
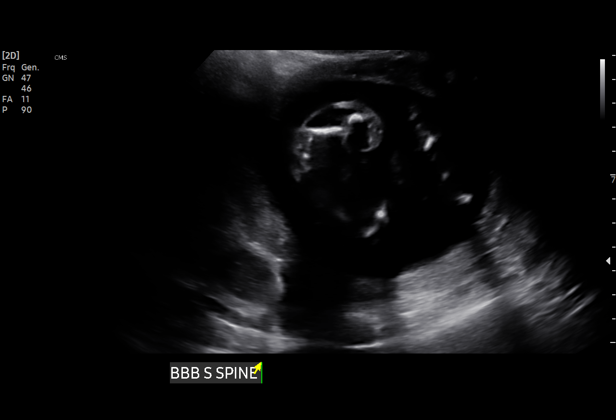
[im 101/160]
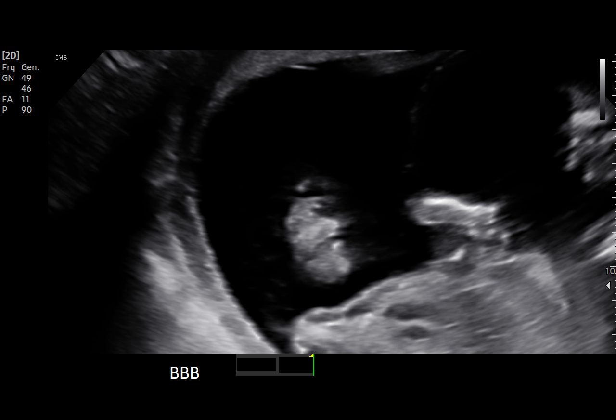
[im 112/160]
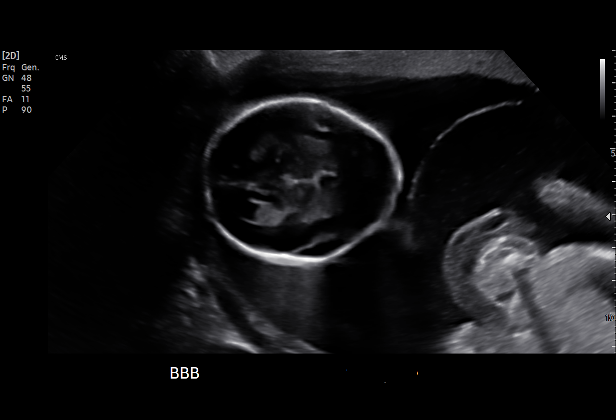
[im 124/160]
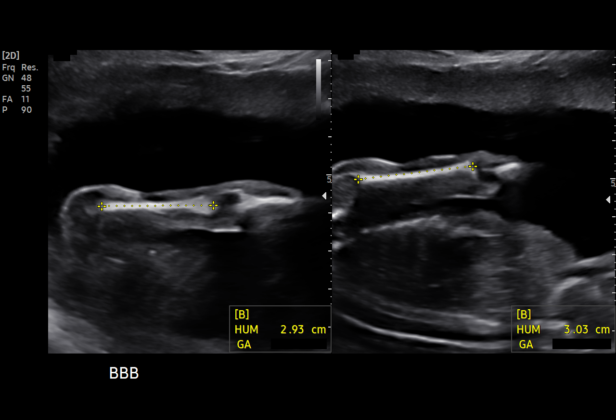
[im 136/160]
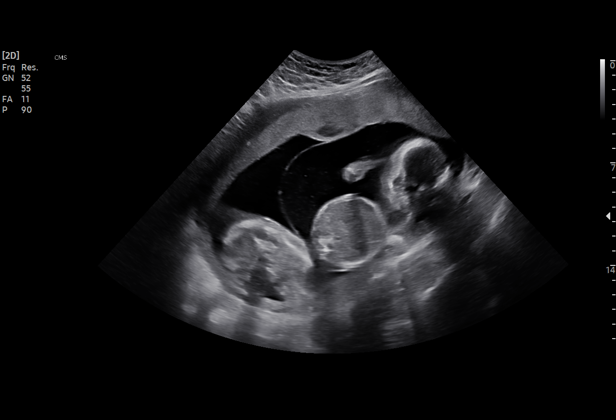
[im 148/160]
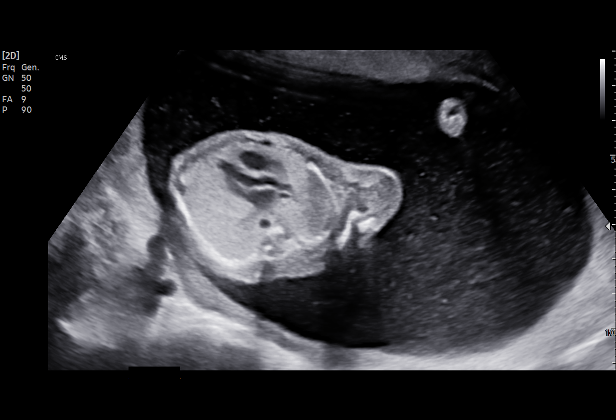
[im 160/160]
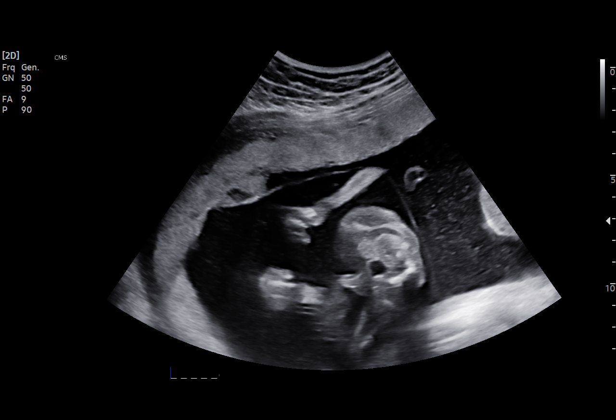

[14 of 28 positions shown; findings below may reference images not displayed]

2  US MFM OB DETAIL ADDL GEST            76811.02    LABELLE SALHA BRACK TIGER
    +14 WK

Indications

 Twin pregnancy, di/di, first trimester
 Obesity complicating pregnancy, second
 trimester  (pregravid BMI 31)
 20 weeks gestation of pregnancy
 Antenatal screening for malformations (Neg
 Horizon 14)
Fetal Evaluation (Fetus A)

 Num Of Fetuses:         2
 Fetal Heart Rate(bpm):  140
 Cardiac Activity:       Observed
 Fetal Lie:              Lower Fetus
 Presentation:           Variable
 Placenta:               Anterior
 P. Cord Insertion:      Visualized
 Membrane Desc:      Dividing Membrane seen

 Amniotic Fluid
 AFI FV:      Within normal limits

                             Largest Pocket(cm)

Biometry (Fetus A)

 BPD:      49.3  mm     G. Age:  20w 6d         84  %    CI:        76.94   %    70 - 86
                                                         FL/HC:      17.0   %    16.8 -
 HC:       178   mm     G. Age:  20w 2d         55  %    HC/AC:      1.13        1.09 -
 AC:      157.5  mm     G. Age:  20w 6d         74  %    FL/BPD:     61.5   %
 FL:       30.3  mm     G. Age:  19w 3d         21  %    FL/AC:      19.2   %    20 - 24
 HUM:      30.9  mm     G. Age:  20w 2d         60  %
 CER:      20.6  mm     G. Age:  19w 5d         64  %
 NFT:       4.2  mm

 CM:        3.4  mm

 Est. FW:     340  gm    0 lb 12 oz      59  %     FW Discordancy      0 \ 8 %
OB History

 Gravidity:    2         Term:   0        Prem:   0        SAB:   1
 TOP:          0       Ectopic:  0        Living: 0
Gestational Age (Fetus A)

 LMP:           20w 0d        Date:  09/15/19                 EDD:   06/21/20
 U/S Today:     20w 3d                                        EDD:   06/18/20
 Best:          20w 0d     Det. By:  LMP  (09/15/19)          EDD:   06/21/20
Anatomy (Fetus A)

 Cranium:               Appears normal         Aortic Arch:            Not well visualized
 Cavum:                 Appears normal         Ductal Arch:            Not well visualized
 Ventricles:            Appears normal         Diaphragm:              Not well visualized
 Choroid Plexus:        Appears normal         Stomach:                Appears normal, left
                                                                       sided
 Cerebellum:            Appears normal         Abdomen:                Appears normal
 Posterior Fossa:       Appears normal         Abdominal Wall:         Appears nml (cord
                                                                       insert, abd wall)
 Nuchal Fold:           Appears normal         Cord Vessels:           Appears normal (3
                                                                       vessel cord)
 Face:                  Appears normal         Kidneys:                Appear normal
                        (orbits and profile)
 Lips:                  Appears normal         Bladder:                Appears normal
 Thoracic:              Appears normal         Spine:                  Appears normal
 Heart:                 Appears normal         Upper Extremities:      Appears normal
                        (4CH, axis, and
                        situs)
 RVOT:                  Appears normal         Lower Extremities:      Appears normal
 LVOT:                  Appears normal

 Other:  Fetus appears to be a male. Right open hand/5th digit visualized.
         Heels/feet visualized. Nasal bone visualized. Technically difficult due
         to fetal position.

Fetal Evaluation (Fetus B)

 Num Of Fetuses:         2
 Fetal Heart Rate(bpm):  152
 Cardiac Activity:       Observed
 Fetal Lie:              Upper Fetus
 Presentation:           Transverse, head to maternal left
 Placenta:               Anterior
 P. Cord Insertion:      Visualized
 Membrane Desc:      Dividing Membrane seen

 Amniotic Fluid
 AFI FV:      Within normal limits

                             Largest Pocket(cm)

Biometry (Fetus B)

 BPD:      48.4  mm     G. Age:  20w 4d         76  %    CI:        74.93   %    70 - 86
                                                         FL/HC:      17.1   %    16.8 -
 HC:      177.4  mm     G. Age:  20w 2d         52  %    HC/AC:      1.21        1.09 -
 AC:      146.3  mm     G. Age:  19w 6d         42  %    FL/BPD:     62.8   %
 FL:       30.4  mm     G. Age:  19w 3d         22  %    FL/AC:      20.8   %    20 - 24
 HUM:      29.6  mm     G. Age:  19w 5d         46  %
 CER:      20.3  mm     G. Age:  19w 4d         57  %
 CM:        3.5  mm

 Est. FW:     312  gm    0 lb 11 oz      33  %     FW Discordancy         8  %
Gestational Age (Fetus B)

 LMP:           20w 0d        Date:  09/15/19                 EDD:   06/21/20
 U/S Today:     20w 0d                                        EDD:   06/21/20
 Best:          20w 0d     Det. By:  LMP  (09/15/19)          EDD:   06/21/20
Anatomy (Fetus B)

 Cranium:               Appears normal         Aortic Arch:            Appears normal
 Cavum:                 Appears normal         Ductal Arch:            Appears normal
 Ventricles:            Appears normal         Diaphragm:              Not well visualized
 Choroid Plexus:        Appears normal         Stomach:                Appears normal, left
                                                                       sided
 Cerebellum:            Appears normal         Abdomen:                Appears normal
 Posterior Fossa:       Appears normal         Abdominal Wall:         Appears nml (cord
                                                                       insert, abd wall)
 Nuchal Fold:           Appears normal         Cord Vessels:           Appears normal (3
                                                                       vessel cord)
 Face:                  Appears normal         Kidneys:                Appear normal
                        (orbits and profile)
 Lips:                  Appears normal         Bladder:                Appears normal
 Thoracic:              Appears normal         Spine:                  Appears normal
 Heart:                 Appears normal         Upper Extremities:      Appears normal
                        (4CH, axis, and
                        situs)
 RVOT:                  Appears normal         Lower Extremities:      Appears normal
 LVOT:                  Appears normal

 Other:  Fetus appears to be a male. Right hand/5th digit visualized. Heels
         visualized. Nasal bone visualized. Technically difficult due to fetal
         position.
Cervix Uterus Adnexa
 Cervix
 Length:           4.02  cm.
 Normal appearance by transabdominal scan.

 Uterus
 No abnormality visualized.

 Right Ovary
 Within normal limits.

 Left Ovary
 Within normal limits.

 Cul De Sac
 No free fluid seen.

 Adnexa
 No abnormality visualized.
Comments

 This patient was seen due to a spontaneously conceived twin
 pregnancy.  She denies any significant past medical history
 and denies any problems in her current pregnancy.  The
 patient reports that she had ultrasounds performed earlier in
 her pregnancy at an OB/GYN practice in [place].  They
 did not report the type of twins these are.
 She had a cell free DNA test drawn earlier in her pregnancy
 that showed no results.
 A thick dividing membrane was noted separating the two
 fetuses, indicating that these are most likely dichorionic,
 diamniotic twins.
 The fetal growth and amniotic fluid level appeared
 appropriate for both twin A and twin B.
 There were no obvious anomalies noted in either twin A or
 twin B today.  However the views of the fetal anatomy were
 limited today due to the fetal positions.
 The limitations of ultrasound in the detection of all anomalies
 was discussed today.
 The management of dichorionic twins was discussed.  She
 was advised that management of twin pregnancies will
 involve frequent ultrasound exams to assess the fetal growth
 and amniotic fluid level. We will continue to follow her with
 serial growth ultrasounds.  Weekly fetal testing for dichorionic
 twins should start at around 36 weeks.  Delivery for
 uncomplicated dichorionic twins should occur at around 38
 weeks.
 The increased risk of preeclampsia, gestational diabetes, and
 preterm birth/labor associated with twin pregnancies was
 discussed.  She was advised that she will continue to be
 followed closely to assess for these conditions. As
 pregnancies with multiple gestations are at increased risk for
 developing preeclampsia, she was advised to start taking a
 daily baby aspirin (81 mg per day) to decrease her risk of
 developing preeclampsia  as soon as possible.
 A follow-up exam was scheduled in 4 weeks to assess the
 fetal growth and to complete the views of the fetal anatomy.

 The patient will have a cell free DNA test ([employer])
 drawn today for screening of fetal aneuploidy and to confirm
 the chorionicity. Two male fetuses were noted today.

## 2023-06-25 LAB — OB RESULTS CONSOLE GC/CHLAMYDIA
Chlamydia: NEGATIVE
Neisseria Gonorrhea: NEGATIVE

## 2023-06-25 LAB — OB RESULTS CONSOLE HIV ANTIBODY (ROUTINE TESTING): HIV: NONREACTIVE

## 2023-06-25 LAB — OB RESULTS CONSOLE RUBELLA ANTIBODY, IGM: Rubella: IMMUNE

## 2023-06-25 LAB — OB RESULTS CONSOLE RPR: RPR: NONREACTIVE

## 2023-06-25 LAB — OB RESULTS CONSOLE HEPATITIS B SURFACE ANTIGEN: Hepatitis B Surface Ag: NEGATIVE

## 2023-06-25 LAB — HEPATITIS C ANTIBODY: HCV Ab: NEGATIVE

## 2023-10-21 ENCOUNTER — Other Ambulatory Visit: Payer: Self-pay | Admitting: Obstetrics and Gynecology

## 2023-10-21 DIAGNOSIS — O4400 Placenta previa specified as without hemorrhage, unspecified trimester: Secondary | ICD-10-CM

## 2023-10-23 ENCOUNTER — Encounter: Payer: Self-pay | Admitting: *Deleted

## 2023-10-23 DIAGNOSIS — O99210 Obesity complicating pregnancy, unspecified trimester: Secondary | ICD-10-CM | POA: Insufficient documentation

## 2023-10-23 DIAGNOSIS — O4400 Placenta previa specified as without hemorrhage, unspecified trimester: Secondary | ICD-10-CM | POA: Insufficient documentation

## 2023-10-23 DIAGNOSIS — O34219 Maternal care for unspecified type scar from previous cesarean delivery: Secondary | ICD-10-CM | POA: Insufficient documentation

## 2023-10-26 ENCOUNTER — Other Ambulatory Visit: Payer: Self-pay | Admitting: *Deleted

## 2023-10-26 ENCOUNTER — Ambulatory Visit: Attending: Obstetrics and Gynecology

## 2023-10-26 ENCOUNTER — Ambulatory Visit (HOSPITAL_BASED_OUTPATIENT_CLINIC_OR_DEPARTMENT_OTHER): Admitting: Obstetrics

## 2023-10-26 VITALS — BP 107/59 | HR 85

## 2023-10-26 DIAGNOSIS — O4402 Placenta previa specified as without hemorrhage, second trimester: Secondary | ICD-10-CM

## 2023-10-26 DIAGNOSIS — E669 Obesity, unspecified: Secondary | ICD-10-CM

## 2023-10-26 DIAGNOSIS — O99212 Obesity complicating pregnancy, second trimester: Secondary | ICD-10-CM | POA: Diagnosis not present

## 2023-10-26 DIAGNOSIS — Z3A27 27 weeks gestation of pregnancy: Secondary | ICD-10-CM | POA: Insufficient documentation

## 2023-10-26 DIAGNOSIS — O34219 Maternal care for unspecified type scar from previous cesarean delivery: Secondary | ICD-10-CM

## 2023-10-26 DIAGNOSIS — O99210 Obesity complicating pregnancy, unspecified trimester: Secondary | ICD-10-CM | POA: Insufficient documentation

## 2023-10-26 DIAGNOSIS — O4400 Placenta previa specified as without hemorrhage, unspecified trimester: Secondary | ICD-10-CM

## 2023-10-26 DIAGNOSIS — O99213 Obesity complicating pregnancy, third trimester: Secondary | ICD-10-CM

## 2023-10-26 NOTE — Progress Notes (Signed)
 MFM consult Note  Destin Maeby Luhrsen is currently at 27 weeks and 0 days.  She was seen due to maternal obesity with a BMI of 41.4.  A low-lying placenta was also noted on earlier ultrasounds performed in your office.  She denies any problems in her current pregnancy.    She has declined all screening tests for fetal aneuploidy in her current pregnancy.  On today's exam, the overall EFW of 2 pounds 6 ounces measures at the 56 percentile for her gestational age.  There was normal amniotic fluid noted.  There were no obvious fetal anomalies noted on today's ultrasound exam.  However, today's exam was limited due to maternal body habitus and the fetal position.  The patient was informed that anomalies may be missed due to technical limitations. If the fetus is in a suboptimal position or maternal habitus is increased, visualization of the fetus in the maternal uterus may be impaired.  The previously noted low-lying placenta/placenta previa has resolved.  The patient may attempt a vaginal delivery at term should she desire.  As her BMI is greater than 40, weekly fetal testing starting at 34 weeks is recommended.  The patient may have these weekly tests scheduled in your office.    A follow-up exam was scheduled in 4 weeks to assess the fetal growth.    We will reassess the views of the fetal anatomy at that exam.    The patient stated that all of her questions were answered today.  A total of 30 minutes was spent counseling and coordinating the care for this patient.  Greater than 50% of the time was spent in direct face-to-face contact.

## 2023-11-23 ENCOUNTER — Other Ambulatory Visit: Payer: Self-pay | Admitting: *Deleted

## 2023-11-23 ENCOUNTER — Ambulatory Visit: Admitting: Obstetrics and Gynecology

## 2023-11-23 ENCOUNTER — Ambulatory Visit: Attending: Obstetrics and Gynecology

## 2023-11-23 VITALS — BP 99/57 | HR 86

## 2023-11-23 DIAGNOSIS — O99210 Obesity complicating pregnancy, unspecified trimester: Secondary | ICD-10-CM | POA: Insufficient documentation

## 2023-11-23 DIAGNOSIS — O34219 Maternal care for unspecified type scar from previous cesarean delivery: Secondary | ICD-10-CM | POA: Insufficient documentation

## 2023-11-23 DIAGNOSIS — E669 Obesity, unspecified: Secondary | ICD-10-CM

## 2023-11-23 DIAGNOSIS — O99213 Obesity complicating pregnancy, third trimester: Secondary | ICD-10-CM | POA: Insufficient documentation

## 2023-11-23 DIAGNOSIS — O4402 Placenta previa specified as without hemorrhage, second trimester: Secondary | ICD-10-CM

## 2023-11-23 DIAGNOSIS — Z3A31 31 weeks gestation of pregnancy: Secondary | ICD-10-CM | POA: Insufficient documentation

## 2023-11-23 NOTE — Progress Notes (Signed)
 Maternal-Fetal Medicine Consultation Name: Kathy Harris MRN: [id number]  G4 P3003 at 31-weeks' gestation. Patient is here for completion of fetal anatomy.  Obstetric history is significant for 2 term cesarean deliveries including a twin delivery. Patient does not have gestational diabetes.  Ultrasound Fetal growth is appropriate for gestational age.  Amniotic fluid normal good fetal activity seen.  Fetal anatomical survey was completed and appears normal. Placenta is anterior and there is no evidence of previa or placenta accreta spectrum.  I reassured the patient of the findings. Grade 3 obesity is associated with a small increase in risk of perinatal mortality (2.5- to 3-fold), but the absolute risk is very small.  I recommended a fetal growth assessment in 4 weeks and weekly antenatal testing from her next visit till delivery. Patient was also counseled that repeat cesarean deliveries increase the risks of placenta previa and/or placenta accreta spectrum.  Recommendations - An appointment was made for her to return in 4 weeks for fetal growth assessment and BPP - Weekly BPP/NST from next visit till delivery. - Weekly antenatal testing may be performed at your office.  Kindly call our office to cancel the appointments if patient will be having ultrasound at your office.   Consultation including face-to-face (more than 50%) counseling 10 minutes.

## 2023-11-24 ENCOUNTER — Other Ambulatory Visit: Payer: Self-pay

## 2023-11-24 ENCOUNTER — Encounter (HOSPITAL_COMMUNITY): Payer: Self-pay | Admitting: Obstetrics and Gynecology

## 2023-11-24 ENCOUNTER — Inpatient Hospital Stay (HOSPITAL_COMMUNITY)
Admission: AD | Admit: 2023-11-24 | Discharge: 2023-11-24 | Disposition: A | Discharge status: Home / Self Care | Attending: Obstetrics and Gynecology | Admitting: Obstetrics and Gynecology

## 2023-11-24 DIAGNOSIS — O4693 Antepartum hemorrhage, unspecified, third trimester: Secondary | ICD-10-CM

## 2023-11-24 DIAGNOSIS — N930 Postcoital and contact bleeding: Secondary | ICD-10-CM | POA: Insufficient documentation

## 2023-11-24 DIAGNOSIS — O09293 Supervision of pregnancy with other poor reproductive or obstetric history, third trimester: Secondary | ICD-10-CM | POA: Diagnosis not present

## 2023-11-24 DIAGNOSIS — O34211 Maternal care for low transverse scar from previous cesarean delivery: Secondary | ICD-10-CM | POA: Insufficient documentation

## 2023-11-24 DIAGNOSIS — N858 Other specified noninflammatory disorders of uterus: Secondary | ICD-10-CM | POA: Insufficient documentation

## 2023-11-24 DIAGNOSIS — Z3A31 31 weeks gestation of pregnancy: Secondary | ICD-10-CM | POA: Diagnosis not present

## 2023-11-24 LAB — URINALYSIS, ROUTINE W REFLEX MICROSCOPIC
Bacteria, UA: NONE SEEN
Bilirubin Urine: NEGATIVE
Glucose, UA: NEGATIVE mg/dL
Ketones, ur: 5 mg/dL — AB
Nitrite: NEGATIVE
Protein, ur: NEGATIVE mg/dL
Specific Gravity, Urine: 1.006 (ref 1.005–1.030)
pH: 7 (ref 5.0–8.0)

## 2023-11-24 LAB — WET PREP, GENITAL
Clue Cells Wet Prep HPF POC: NONE SEEN
Sperm: NONE SEEN
Trich, Wet Prep: NONE SEEN
WBC, Wet Prep HPF POC: >=10 — AB (ref <10)
Yeast Wet Prep HPF POC: NONE SEEN

## 2023-11-24 NOTE — MAU Provider Note (Signed)
 Chief Complaint:  Vaginal Bleeding   HPI   Event Date/Time   First Provider Initiated Contact with Patient 11/24/23 1154      Kathy Harris is a 23 y.o. [id number] at [redacted]w[redacted]d presenting to maternity admissions reporting vaginal bleeding. She endorses spotting when wiping after she went to the bathroom this morning.  She denies abdominal pain.  Denies leaking of fluid, abnormal vaginal discharge.  Endorses good fetal movement.  Last intercourse was 2 days ago. Positive history for history of C-section, negative for history of abruption, history of ruptured membranes, previa on ultrasound, and history of preterm delivery. Patient does not have history of risk factors: drug use, smoking, hypertension, and infections.  Pregnancy Course: Receives care at [facility]. Prenatal records reviewed. Placenta previa earlier in pregnancy resolved as of 10/26/2023.   Past Medical History:  Diagnosis Date   Anemia    [employer] 05/31/2020   Cesarean delivery delivered 06/05/2020   Encounter for routine postpartum follow-up 07/13/2020   Medical history non-contributory    Non-reassuring fetal heart rate or rhythm affecting management of mother 06/05/2020   Supervision of high risk pregnancy in second trimester 02/01/2020   Term pregnancy 01/22/2022   OB History  Gravida Para Term Preterm AB Living  4 2 2  1 3   SAB IAB Ectopic Multiple Live Births  1   1 3     # Outcome Date GA Lbr Len/2nd Weight Sex Type Anes PTL Lv  4 Current           3 Term 01/22/22 [redacted]w[redacted]d  3830 g F CS-LTranv Spinal  LIV  2A Term 06/05/20 [redacted]w[redacted]d  2725 g M CS-LTranv Spinal  LIV  2B Term 06/05/20 [redacted]w[redacted]d  2855 g M CS-LTranv Spinal  LIV  1 SAB 2019           Past Surgical History:  Procedure Laterality Date   CESAREAN SECTION     CESAREAN SECTION N/A 01/22/2022   Procedure: CESAREAN SECTION;  Surgeon: Reggy Capers, MD;  Location: [facility] LD ORS;  Service: Obstetrics;  Laterality: N/A;   CESAREAN SECTION MULTI-GESTATIONAL N/A 06/05/2020    Procedure: CESAREAN SECTION MULTI-GESTATIONAL;  Surgeon: Malka Sea, DO;  Location: [facility] LD ORS;  Service: Obstetrics;  Laterality: N/A;   TONSILLECTOMY     WISDOM TOOTH EXTRACTION     Family History  Problem Relation Age of Onset   Heart murmur Mother    Diabetes Father    Hypertension Father    Social History   Tobacco Use   Smoking status: Never   Smokeless tobacco: Never  Vaping Use   Vaping status: Never Used  Substance Use Topics   Alcohol use: Never   Drug use: Never   No Known Allergies Medications Prior to Admission  Medication Sig Dispense Refill Last Dose/Taking   Prenatal Vit-Fe Fumarate-FA (MULTIVITAMIN-PRENATAL) 27-0.8 MG TABS tablet Take 1 tablet by mouth daily at 12 noon.   11/23/2023    I have reviewed patient's Past Medical Hx, Surgical Hx, Family Hx, Social Hx, medications and allergies.   ROS  Pertinent items noted in HPI and remainder of comprehensive ROS otherwise negative.   PHYSICAL EXAM  Patient Vitals for the past 24 hrs:  BP Temp Temp src Pulse Resp SpO2 Height Weight  11/24/23 1148 102/69 97.7 F (36.5 C) Oral 94 16 -- -- --  11/24/23 1146 -- -- -- -- -- 98 % -- --  11/24/23 1145 -- -- -- -- -- 98 % -- --  11/24/23 1140 -- -- -- -- -- 97 % -- --  11/24/23 1124 100/62 98.2 F (36.8 C) Oral 90 18 99 % -- --  11/24/23 1118 -- -- -- -- -- -- 4\' 11"  (1.499 m) 97.3 kg    Constitutional: Well-developed, well-nourished female in no acute distress.  HEENT: atraumatic, normocephalic. Neck has normal ROM. EOM intact. Cardiovascular: normal rate & rhythm, warm and well-perfused Respiratory: normal effort, no problems with respiration noted GI: Abd soft, non-tender, non-distended MSK: Extremities nontender, no edema, normal ROM Skin: warm and dry. Acyanotic, no jaundice or pallor. Neurologic: Alert and oriented x 4. No abnormal coordination. Psychiatric: Normal mood. Speech not slurred, not rapid/pressured. Patient is cooperative. GU: no CVA  tenderness Pelvic exam: VULVA: normal appearing vulva with no masses, tenderness or lesions, VAGINA: normal appearing vagina with normal color and discharge, no lesions, CERVIX: normal appearing cervix without discharge or lesions, multiparous but closed os, exam chaperoned by Monta Anton RN.     Fetal Tracing: Baseline FHR: 135 per minute Fetal heart variability: moderate Fetal Heart Rate accelerations: yes Fetal Heart Rate decelerations: none Fetal Non-stress Test: reactive Toco: uterine irritability   Labs: Results for orders placed or performed during the hospital encounter of 11/24/23 (from the past 24 hours)  Urinalysis, Routine w reflex microscopic -Urine, Clean Catch     Status: Abnormal   Collection Time: 11/24/23 11:27 AM  Result Value Ref Range   Color, Urine YELLOW YELLOW   APPearance CLEAR CLEAR   Specific Gravity, Urine 1.006 1.005 - 1.030   pH 7.0 5.0 - 8.0   Glucose, UA NEGATIVE NEGATIVE mg/dL   Hgb urine dipstick SMALL (A) NEGATIVE   Bilirubin Urine NEGATIVE NEGATIVE   Ketones, ur 5 (A) NEGATIVE mg/dL   Protein, ur NEGATIVE NEGATIVE mg/dL   Nitrite NEGATIVE NEGATIVE   Leukocytes,Ua MODERATE (A) NEGATIVE   RBC / HPF 0-5 0 - 5 RBC/hpf   WBC, UA 0-5 0 - 5 WBC/hpf   Bacteria, UA NONE SEEN NONE SEEN   Squamous Epithelial / HPF 0-5 0 - 5 /HPF  Wet prep, genital     Status: Abnormal   Collection Time: 11/24/23 11:54 AM  Result Value Ref Range   Yeast Wet Prep HPF POC NONE SEEN NONE SEEN   Trich, Wet Prep NONE SEEN NONE SEEN   Clue Cells Wet Prep HPF POC NONE SEEN NONE SEEN   WBC, Wet Prep HPF POC >=10 (A) <10   Sperm NONE SEEN     Imaging:  No results found.  MDM & MAU COURSE  MDM: High  MAU Course: -Vital signs within normal limits. -No evidence of placenta previa on US  done yesterday with MFM, previously noted to be resolved on US  10/26/2023. No abdominal pain today. -UA, wet prep, and GC for possible infection. UA and wet prep negative, GC pending and  will result after discharge. -Speculum exam negative for cervical or vaginal lesions, blood in vaginal canal.  Differential diagnosis considered for 2nd or 3rd trimester vaginal bleeding includes but is not limited to: preterm labor, placenta previa, vasa previa, placental abruption, infection, marginal separation, cervical or vaginal lesion   Orders Placed This Encounter  Procedures   Wet prep, genital   Urinalysis, Routine w reflex microscopic -Urine, Clean Catch   No orders of the defined types were placed in this encounter.   ASSESSMENT   1. Vaginal bleeding in pregnancy, third trimester   2. Postcoital bleeding   3. [redacted] weeks gestation of pregnancy  PLAN  Discharge home in stable condition with preterm labor precautions.  Discussed that spotting is likely due to coitus, provided precautions warranting reevaluation.    Follow-up Information     Ob/Gyn, [facility] Follow up.   Why: As scheduled for ongoing prenatal care Contact information: [address] [place] [address] [phone]                  Allergies as of 11/24/2023   No Known Allergies      Medication List     TAKE these medications    multivitamin-prenatal 27-0.8 MG Tabs tablet Take 1 tablet by mouth daily at 12 noon.        Noreene Bearded, PA

## 2023-11-24 NOTE — MAU Note (Signed)
 Kathy Harris is a 23 y.o. at [redacted]w[redacted]d here in MAU reporting: she woke to having small amount of VB, reports continues to have spotting with wiping.  Reports last intercourse two days ago.  Reports "slight" intermittent cramping in pelvic area. Denies LOF.  Endorses +FM.  LMP: 04/20/2023 Onset of complaint: today Pain score: 5 Vitals:   11/24/23 1124  BP: 100/62  Pulse: 90  Resp: 18  Temp: 98.2 F (36.8 C)  SpO2: 99%     FHT: 133 bpm  Lab orders placed from triage: UA

## 2023-11-24 NOTE — Discharge Instructions (Signed)
 If you are less than 36 weeks, please go to the hospital of you have:  1. Contractions: Contractions feels like menstrual cramps. You should go to the hospital if you have more than 6 contractions in an hour, even after you have rested and drank at least 16 ounces of water.  2. Vaginal Bleeding: You have bleeding that soaks through more that 2 pads per hour for an hour or more. 3. Leaking Fluid: sometimes it is obvious when your water breaks causing a huge gush of fluid. However, many times it may it may be much more subtle. You should go to the hospital if you have a constant leakage of fluid from your vagina, enough to soak a pad when you are walking around.  4. Decreased Fetal Movement: if you think that you baby's movement is decreased, eat a snack and rest on your left side in a quiet room for one hour. If you have not felt the baby move more than 6 times in an hour GO TO THE HOSPITAL.

## 2023-11-25 LAB — GC/CHLAMYDIA PROBE AMP (~~LOC~~) NOT AT ARMC
Chlamydia: NEGATIVE
Comment: NEGATIVE
Comment: NORMAL
Neisseria Gonorrhea: NEGATIVE

## 2023-12-04 ENCOUNTER — Other Ambulatory Visit: Payer: Self-pay | Admitting: Obstetrics and Gynecology

## 2023-12-04 DIAGNOSIS — O34211 Maternal care for low transverse scar from previous cesarean delivery: Secondary | ICD-10-CM

## 2023-12-24 ENCOUNTER — Ambulatory Visit: Attending: Obstetrics and Gynecology | Admitting: Obstetrics and Gynecology

## 2023-12-24 ENCOUNTER — Ambulatory Visit

## 2023-12-24 VITALS — BP 105/58 | HR 80

## 2023-12-24 DIAGNOSIS — E669 Obesity, unspecified: Secondary | ICD-10-CM

## 2023-12-24 DIAGNOSIS — O4403 Placenta previa specified as without hemorrhage, third trimester: Secondary | ICD-10-CM

## 2023-12-24 DIAGNOSIS — Z3A35 35 weeks gestation of pregnancy: Secondary | ICD-10-CM | POA: Diagnosis not present

## 2023-12-24 DIAGNOSIS — Z363 Encounter for antenatal screening for malformations: Secondary | ICD-10-CM | POA: Diagnosis not present

## 2023-12-24 DIAGNOSIS — Z362 Encounter for other antenatal screening follow-up: Secondary | ICD-10-CM | POA: Diagnosis not present

## 2023-12-24 DIAGNOSIS — O99213 Obesity complicating pregnancy, third trimester: Secondary | ICD-10-CM | POA: Insufficient documentation

## 2023-12-24 DIAGNOSIS — O99210 Obesity complicating pregnancy, unspecified trimester: Secondary | ICD-10-CM

## 2023-12-24 DIAGNOSIS — O34219 Maternal care for unspecified type scar from previous cesarean delivery: Secondary | ICD-10-CM | POA: Diagnosis not present

## 2023-12-24 DIAGNOSIS — O4400 Placenta previa specified as without hemorrhage, unspecified trimester: Secondary | ICD-10-CM

## 2023-12-24 NOTE — Progress Notes (Signed)
 After review, MFM consult with provider is not indicated for today  Arna Ranks, MD 12/24/2023 12:11 PM  [facility]

## 2023-12-28 ENCOUNTER — Ambulatory Visit

## 2023-12-28 ENCOUNTER — Other Ambulatory Visit

## 2023-12-29 ENCOUNTER — Other Ambulatory Visit

## 2023-12-31 LAB — OB RESULTS CONSOLE GBS: GBS: POSITIVE

## 2024-01-04 ENCOUNTER — Encounter (HOSPITAL_COMMUNITY): Payer: Self-pay | Admitting: Obstetrics and Gynecology

## 2024-01-04 ENCOUNTER — Other Ambulatory Visit: Payer: Self-pay

## 2024-01-04 ENCOUNTER — Inpatient Hospital Stay (HOSPITAL_COMMUNITY)
Admission: AD | Admit: 2024-01-04 | Discharge: 2024-01-04 | Disposition: A | Discharge status: Home / Self Care | Attending: Obstetrics and Gynecology | Admitting: Obstetrics and Gynecology

## 2024-01-04 ENCOUNTER — Other Ambulatory Visit

## 2024-01-04 DIAGNOSIS — O471 False labor at or after 37 completed weeks of gestation: Secondary | ICD-10-CM | POA: Diagnosis present

## 2024-01-04 DIAGNOSIS — Z3A37 37 weeks gestation of pregnancy: Secondary | ICD-10-CM | POA: Diagnosis not present

## 2024-01-04 NOTE — MAU Note (Signed)
 Kathy Harris is a 23 y.o. at [redacted]w[redacted]d here in MAU reporting: ctx since 11pm 3-5 min apart. Denies any leaking. Good fetal movement felt. Reports some bloody show. Repeat c-section8/4/25  LMP:  Onset of complaint: 11pm Pain score: 7 Vitals:   01/04/24 0157  BP: 113/68  Pulse: 99  Resp: 18  Temp: 98.6 F (37 C)     FHT: 131   Lab orders placed from triage: labor eval

## 2024-01-04 NOTE — Discharge Instructions (Signed)
 2/3-1-1 Rule Go to MAU for painful contractions every 2-3 minutes, lasting 1 minute each for 1.5 hours.

## 2024-01-04 NOTE — MAU Provider Note (Signed)
 Ms. Kathy Harris is a [id number] at [redacted]w[redacted]d seen in MAU for labor. RN labor check, not seen by provider.   SVE by RN Dilation: Fingertip Effacement (%): Thick Exam by:: Bria Torrence   NST - FHR: 130 bpm / moderate variability / accels present / decels absent / TOCO: irregular UCs with UI    Plan:  D/C home with labor precautions Keep scheduled appt with [facility] next week  Ala Cart, CNM  01/04/2024 3:28 AM

## 2024-01-07 ENCOUNTER — Encounter (HOSPITAL_COMMUNITY): Payer: Self-pay

## 2024-01-07 ENCOUNTER — Telehealth (HOSPITAL_COMMUNITY): Payer: Self-pay | Admitting: *Deleted

## 2024-01-07 NOTE — Telephone Encounter (Signed)
 Preadmission screen

## 2024-01-07 NOTE — Patient Instructions (Addendum)
 Korra Octivia Canion  01/07/2024   Your procedure is scheduled on:  01/18/2024  Arrive at 1015 at Entrance C on [address] at [facility]  and [employer]. You are invited to use the FREE valet parking or use the Visitor's parking deck.  Pick up the phone at the desk and dial [phone].  Call this number if you have problems the morning of surgery: [phone]  Remember:   Do not eat food:(After Midnight) Desps de medianoche.  You may drink clear liquids until  __0815___.  Clear liquids means a liquid you can see thru.  It can have color such as Cola or Kool aid.  Tea is OK and coffee as long as no milk or creamer of any kind.  Take these medicines the morning of surgery with A SIP OF WATER:  none   Do not wear jewelry, make-up or nail polish.  Do not wear lotions, powders, or perfumes. Do not wear deodorant.  Do not shave 48 hours prior to surgery.  Do not bring valuables to the hospital.  [facility] is not   responsible for any belongings or valuables brought to the hospital.  Contacts, dentures or bridgework may not be worn into surgery.  Leave suitcase in the car. After surgery it may be brought to your room.  For patients admitted to the hospital, checkout time is 11:00 AM the day of              discharge.      Please read over the following fact sheets that you were given:     Preparing for Surgery

## 2024-01-08 ENCOUNTER — Encounter (HOSPITAL_COMMUNITY): Payer: Self-pay

## 2024-01-15 ENCOUNTER — Encounter (HOSPITAL_COMMUNITY)
Admission: RE | Admit: 2024-01-15 | Discharge: 2024-01-15 | Disposition: A | Discharge status: Home / Self Care | Source: Ambulatory Visit | Attending: Obstetrics and Gynecology | Admitting: Obstetrics and Gynecology

## 2024-01-15 DIAGNOSIS — Z01818 Encounter for other preprocedural examination: Secondary | ICD-10-CM | POA: Diagnosis present

## 2024-01-15 DIAGNOSIS — Z01812 Encounter for preprocedural laboratory examination: Secondary | ICD-10-CM | POA: Insufficient documentation

## 2024-01-15 DIAGNOSIS — Z3A38 38 weeks gestation of pregnancy: Secondary | ICD-10-CM | POA: Insufficient documentation

## 2024-01-15 DIAGNOSIS — O34211 Maternal care for low transverse scar from previous cesarean delivery: Secondary | ICD-10-CM | POA: Insufficient documentation

## 2024-01-15 LAB — TYPE AND SCREEN
ABO/RH(D): AB POS
Antibody Screen: NEGATIVE

## 2024-01-15 LAB — CBC
HCT: 33.7 % — ABNORMAL LOW (ref 36.0–46.0)
Hemoglobin: 10.9 g/dL — ABNORMAL LOW (ref 12.0–15.0)
MCH: 27.7 pg (ref 26.0–34.0)
MCHC: 32.3 g/dL (ref 30.0–36.0)
MCV: 85.5 fL (ref 80.0–100.0)
Platelets: 196 K/uL (ref 150–400)
RBC: 3.94 MIL/uL (ref 3.87–5.11)
RDW: 13.7 % (ref 11.5–15.5)
WBC: 6 K/uL (ref 4.0–10.5)
nRBC: 0 % (ref 0.0–0.2)

## 2024-01-15 LAB — RPR: RPR Ser Ql: NONREACTIVE

## 2024-01-18 ENCOUNTER — Encounter (HOSPITAL_COMMUNITY): Payer: Self-pay | Admitting: Obstetrics and Gynecology

## 2024-01-18 ENCOUNTER — Inpatient Hospital Stay (HOSPITAL_COMMUNITY)
Admission: RE | Admit: 2024-01-18 | Discharge: 2024-01-21 | DRG: 788 | Disposition: A | Discharge status: Home / Self Care | Payer: Self-pay | Attending: Obstetrics and Gynecology | Admitting: Obstetrics and Gynecology

## 2024-01-18 ENCOUNTER — Encounter (HOSPITAL_COMMUNITY)
Admission: RE | Disposition: A | Discharge status: Home / Self Care | Payer: Self-pay | Source: Home / Self Care | Attending: Obstetrics and Gynecology

## 2024-01-18 ENCOUNTER — Inpatient Hospital Stay (HOSPITAL_COMMUNITY): Admitting: Anesthesiology

## 2024-01-18 ENCOUNTER — Other Ambulatory Visit: Payer: Self-pay

## 2024-01-18 DIAGNOSIS — O34211 Maternal care for low transverse scar from previous cesarean delivery: Secondary | ICD-10-CM | POA: Diagnosis present

## 2024-01-18 DIAGNOSIS — O403XX0 Polyhydramnios, third trimester, not applicable or unspecified: Secondary | ICD-10-CM | POA: Diagnosis present

## 2024-01-18 DIAGNOSIS — Z833 Family history of diabetes mellitus: Secondary | ICD-10-CM

## 2024-01-18 DIAGNOSIS — O99824 Streptococcus B carrier state complicating childbirth: Secondary | ICD-10-CM | POA: Diagnosis present

## 2024-01-18 DIAGNOSIS — E66813 Obesity, class 3: Secondary | ICD-10-CM | POA: Diagnosis present

## 2024-01-18 DIAGNOSIS — O99214 Obesity complicating childbirth: Secondary | ICD-10-CM | POA: Diagnosis present

## 2024-01-18 DIAGNOSIS — Z8249 Family history of ischemic heart disease and other diseases of the circulatory system: Secondary | ICD-10-CM | POA: Diagnosis not present

## 2024-01-18 DIAGNOSIS — Z3A39 39 weeks gestation of pregnancy: Secondary | ICD-10-CM

## 2024-01-18 DIAGNOSIS — O9902 Anemia complicating childbirth: Secondary | ICD-10-CM | POA: Diagnosis present

## 2024-01-18 DIAGNOSIS — Z3490 Encounter for supervision of normal pregnancy, unspecified, unspecified trimester: Secondary | ICD-10-CM

## 2024-01-18 SURGERY — Surgical Case
Anesthesia: Spinal | Site: Abdomen

## 2024-01-18 MED ORDER — KETOROLAC TROMETHAMINE 30 MG/ML IJ SOLN: 30.0000 mg | Freq: Four times a day (QID) | INTRAMUSCULAR | Status: AC | PRN

## 2024-01-18 MED ORDER — DEXAMETHASONE SODIUM PHOSPHATE 4 MG/ML IJ SOLN
INTRAMUSCULAR | Status: AC
  Filled 2024-01-18: qty 2

## 2024-01-18 MED ORDER — CEFAZOLIN SODIUM-DEXTROSE 2-4 GM/100ML-% IV SOLN
2.0000 g | INTRAVENOUS | Status: AC
  Administered 2024-01-18: 2 g via INTRAVENOUS

## 2024-01-18 MED ORDER — IBUPROFEN 600 MG PO TABS
600.0000 mg | ORAL_TABLET | Freq: Four times a day (QID) | ORAL | Status: DC
  Administered 2024-01-19 – 2024-01-21 (×8): 600 mg via ORAL
  Filled 2024-01-18 (×8): qty 1

## 2024-01-18 MED ORDER — SCOPOLAMINE 1 MG/3DAYS TD PT72
MEDICATED_PATCH | TRANSDERMAL | Status: DC | PRN
  Administered 2024-01-18: 1 via TRANSDERMAL

## 2024-01-18 MED ORDER — SCOPOLAMINE 1 MG/3DAYS TD PT72
1.0000 | MEDICATED_PATCH | Freq: Once | TRANSDERMAL | Status: DC
  Filled 2024-01-18: qty 1

## 2024-01-18 MED ORDER — MORPHINE SULFATE (PF) 0.5 MG/ML IJ SOLN
INTRAMUSCULAR | Status: DC | PRN
  Administered 2024-01-18: 150 ug via INTRATHECAL

## 2024-01-18 MED ORDER — DIPHENHYDRAMINE HCL 25 MG PO CAPS: 25.0000 mg | ORAL_CAPSULE | ORAL | Status: DC | PRN

## 2024-01-18 MED ORDER — OXYTOCIN-SODIUM CHLORIDE 30-0.9 UT/500ML-% IV SOLN
INTRAVENOUS | Status: AC
Start: 2024-01-18 — End: 2024-01-18
  Filled 2024-01-18: qty 500

## 2024-01-18 MED ORDER — SIMETHICONE 80 MG PO CHEW: 80.0000 mg | CHEWABLE_TABLET | ORAL | Status: DC | PRN

## 2024-01-18 MED ORDER — OXYCODONE HCL 5 MG/5ML PO SOLN: 5.0000 mg | Freq: Once | ORAL | Status: DC | PRN

## 2024-01-18 MED ORDER — KETOROLAC TROMETHAMINE 30 MG/ML IJ SOLN
30.0000 mg | Freq: Four times a day (QID) | INTRAMUSCULAR | Status: AC
  Administered 2024-01-18 – 2024-01-19 (×2): 30 mg via INTRAVENOUS
  Filled 2024-01-18 (×2): qty 1

## 2024-01-18 MED ORDER — FENTANYL CITRATE (PF) 100 MCG/2ML IJ SOLN
INTRAMUSCULAR | Status: AC
  Filled 2024-01-18: qty 2

## 2024-01-18 MED ORDER — NALOXONE HCL 0.4 MG/ML IJ SOLN: 0.4000 mg | INTRAMUSCULAR | Status: DC | PRN

## 2024-01-18 MED ORDER — DIPHENHYDRAMINE HCL 25 MG PO CAPS: 25.0000 mg | ORAL_CAPSULE | Freq: Four times a day (QID) | ORAL | Status: DC | PRN

## 2024-01-18 MED ORDER — ACETAMINOPHEN 500 MG PO TABS: 1000.0000 mg | ORAL_TABLET | Freq: Four times a day (QID) | ORAL | Status: DC

## 2024-01-18 MED ORDER — LACTATED RINGERS IV SOLN: INTRAVENOUS | Status: DC

## 2024-01-18 MED ORDER — DIBUCAINE (PERIANAL) 1 % EX OINT
1.0000 | TOPICAL_OINTMENT | CUTANEOUS | Status: DC | PRN
Start: 2024-01-18 — End: 2024-01-21

## 2024-01-18 MED ORDER — NALOXONE HCL 4 MG/10ML IJ SOLN: 1.0000 ug/kg/h | INTRAVENOUS | Status: DC | PRN

## 2024-01-18 MED ORDER — CEFAZOLIN SODIUM-DEXTROSE 2-4 GM/100ML-% IV SOLN
INTRAVENOUS | Status: AC
  Filled 2024-01-18: qty 100

## 2024-01-18 MED ORDER — METOCLOPRAMIDE HCL 5 MG/ML IJ SOLN: INTRAMUSCULAR | Status: DC | PRN

## 2024-01-18 MED ORDER — TRANEXAMIC ACID-NACL 1000-0.7 MG/100ML-% IV SOLN
1000.0000 mg | Freq: Once | INTRAVENOUS | Status: AC
  Administered 2024-01-18: 1000 mg via INTRAVENOUS

## 2024-01-18 MED ORDER — ONDANSETRON HCL 4 MG/2ML IJ SOLN
INTRAMUSCULAR | Status: DC | PRN
  Administered 2024-01-18: 4 mg via INTRAVENOUS

## 2024-01-18 MED ORDER — SODIUM CHLORIDE 0.9% FLUSH: 3.0000 mL | INTRAVENOUS | Status: DC | PRN

## 2024-01-18 MED ORDER — MENTHOL 3 MG MT LOZG
1.0000 | LOZENGE | OROMUCOSAL | Status: DC | PRN
Start: 2024-01-18 — End: 2024-01-21

## 2024-01-18 MED ORDER — ONDANSETRON HCL 4 MG/2ML IJ SOLN
INTRAMUSCULAR | Status: AC
  Filled 2024-01-18: qty 2

## 2024-01-18 MED ORDER — ZOLPIDEM TARTRATE 5 MG PO TABS: 5.0000 mg | ORAL_TABLET | Freq: Every evening | ORAL | Status: DC | PRN

## 2024-01-18 MED ORDER — PRENATAL MULTIVITAMIN CH
1.0000 | ORAL_TABLET | Freq: Every day | ORAL | Status: DC
  Administered 2024-01-19 – 2024-01-21 (×3): 1 via ORAL
  Filled 2024-01-18 (×3): qty 1

## 2024-01-18 MED ORDER — PHENYLEPHRINE 80 MCG/ML (10ML) SYRINGE FOR IV PUSH (FOR BLOOD PRESSURE SUPPORT)
PREFILLED_SYRINGE | INTRAVENOUS | Status: AC
  Filled 2024-01-18: qty 10

## 2024-01-18 MED ORDER — LIDOCAINE HCL (PF) 1 % IJ SOLN
INTRAMUSCULAR | Status: AC
  Filled 2024-01-18: qty 5

## 2024-01-18 MED ORDER — METOCLOPRAMIDE HCL 5 MG/ML IJ SOLN
INTRAMUSCULAR | Status: AC
  Filled 2024-01-18: qty 2

## 2024-01-18 MED ORDER — MORPHINE SULFATE (PF) 0.5 MG/ML IJ SOLN
INTRAMUSCULAR | Status: AC
  Filled 2024-01-18: qty 10

## 2024-01-18 MED ORDER — OXYCODONE HCL 5 MG PO TABS: 5.0000 mg | ORAL_TABLET | Freq: Once | ORAL | Status: DC | PRN

## 2024-01-18 MED ORDER — ACETAMINOPHEN 500 MG PO TABS
1000.0000 mg | ORAL_TABLET | Freq: Four times a day (QID) | ORAL | Status: DC
  Administered 2024-01-18 – 2024-01-21 (×11): 1000 mg via ORAL
  Filled 2024-01-18 (×12): qty 2

## 2024-01-18 MED ORDER — ACETAMINOPHEN 10 MG/ML IV SOLN
INTRAVENOUS | Status: DC | PRN
  Administered 2024-01-18: 1000 mg via INTRAVENOUS

## 2024-01-18 MED ORDER — KETOROLAC TROMETHAMINE 30 MG/ML IJ SOLN
30.0000 mg | Freq: Four times a day (QID) | INTRAMUSCULAR | Status: AC | PRN
Start: 2024-01-18 — End: 2024-01-19
  Administered 2024-01-18: 30 mg via INTRAVENOUS

## 2024-01-18 MED ORDER — PHENYLEPHRINE HCL-NACL 20-0.9 MG/250ML-% IV SOLN
INTRAVENOUS | Status: DC | PRN
  Administered 2024-01-18: 80 ug/min via INTRAVENOUS

## 2024-01-18 MED ORDER — PHENYLEPHRINE HCL-NACL 20-0.9 MG/250ML-% IV SOLN
INTRAVENOUS | Status: AC
  Filled 2024-01-18: qty 250

## 2024-01-18 MED ORDER — LACTATED RINGERS IV SOLN: INTRAVENOUS | Status: AC

## 2024-01-18 MED ORDER — BUPIVACAINE IN DEXTROSE 0.75-8.25 % IT SOLN
INTRATHECAL | Status: DC | PRN
Start: 2024-01-18 — End: 2024-01-18
  Administered 2024-01-18: 1.8 mL via INTRATHECAL

## 2024-01-18 MED ORDER — POVIDONE-IODINE 10 % EX SWAB
2.0000 | Freq: Once | CUTANEOUS | Status: AC
  Administered 2024-01-18: 2 via TOPICAL

## 2024-01-18 MED ORDER — DEXAMETHASONE SODIUM PHOSPHATE 10 MG/ML IJ SOLN
INTRAMUSCULAR | Status: DC | PRN
  Administered 2024-01-18: 10 mg via INTRAVENOUS

## 2024-01-18 MED ORDER — EPHEDRINE SULFATE-NACL 50-0.9 MG/10ML-% IV SOSY
PREFILLED_SYRINGE | INTRAVENOUS | Status: DC | PRN
  Administered 2024-01-18: 5 mg via INTRAVENOUS
  Administered 2024-01-18: 10 mg via INTRAVENOUS

## 2024-01-18 MED ORDER — ONDANSETRON HCL 4 MG/2ML IJ SOLN: 4.0000 mg | Freq: Three times a day (TID) | INTRAMUSCULAR | Status: DC | PRN

## 2024-01-18 MED ORDER — OXYCODONE HCL 5 MG PO TABS
5.0000 mg | ORAL_TABLET | ORAL | Status: DC | PRN
  Administered 2024-01-19: 5 mg via ORAL
  Filled 2024-01-18: qty 1

## 2024-01-18 MED ORDER — WITCH HAZEL-GLYCERIN EX PADS: 1.0000 | MEDICATED_PAD | CUTANEOUS | Status: DC | PRN

## 2024-01-18 MED ORDER — KETOROLAC TROMETHAMINE 30 MG/ML IJ SOLN
INTRAMUSCULAR | Status: AC
Start: 2024-01-18 — End: 2024-01-18
  Filled 2024-01-18: qty 1

## 2024-01-18 MED ORDER — ACETAMINOPHEN 10 MG/ML IV SOLN
INTRAVENOUS | Status: AC
  Filled 2024-01-18: qty 100

## 2024-01-18 MED ORDER — SENNOSIDES-DOCUSATE SODIUM 8.6-50 MG PO TABS
2.0000 | ORAL_TABLET | Freq: Every day | ORAL | Status: DC
  Administered 2024-01-19 – 2024-01-21 (×3): 2 via ORAL
  Filled 2024-01-18 (×3): qty 2

## 2024-01-18 MED ORDER — COCONUT OIL OIL: 1.0000 | TOPICAL_OIL | Status: DC | PRN

## 2024-01-18 MED ORDER — DIPHENHYDRAMINE HCL 50 MG/ML IJ SOLN: 12.5000 mg | INTRAMUSCULAR | Status: DC | PRN

## 2024-01-18 MED ORDER — KETOROLAC TROMETHAMINE 30 MG/ML IJ SOLN: 30.0000 mg | Freq: Once | INTRAMUSCULAR | Status: DC

## 2024-01-18 MED ORDER — METHYLERGONOVINE MALEATE 0.2 MG PO TABS: 0.2000 mg | ORAL_TABLET | ORAL | Status: DC | PRN

## 2024-01-18 MED ORDER — METHYLERGONOVINE MALEATE 0.2 MG/ML IJ SOLN: 0.2000 mg | INTRAMUSCULAR | Status: DC | PRN

## 2024-01-18 MED ORDER — SIMETHICONE 80 MG PO CHEW
80.0000 mg | CHEWABLE_TABLET | Freq: Three times a day (TID) | ORAL | Status: DC
  Administered 2024-01-18 – 2024-01-21 (×8): 80 mg via ORAL
  Filled 2024-01-18 (×9): qty 1

## 2024-01-18 MED ORDER — OXYTOCIN-SODIUM CHLORIDE 30-0.9 UT/500ML-% IV SOLN
INTRAVENOUS | Status: DC | PRN
  Administered 2024-01-18: 300 mL via INTRAVENOUS

## 2024-01-18 MED ORDER — FENTANYL CITRATE (PF) 100 MCG/2ML IJ SOLN
INTRAMUSCULAR | Status: DC | PRN
  Administered 2024-01-18: 15 ug via INTRATHECAL

## 2024-01-18 MED ORDER — SCOPOLAMINE 1 MG/3DAYS TD PT72
MEDICATED_PATCH | TRANSDERMAL | Status: AC
  Filled 2024-01-18: qty 1

## 2024-01-18 MED ORDER — FENTANYL CITRATE (PF) 100 MCG/2ML IJ SOLN: 25.0000 ug | INTRAMUSCULAR | Status: DC | PRN

## 2024-01-18 MED ORDER — TRANEXAMIC ACID-NACL 1000-0.7 MG/100ML-% IV SOLN
INTRAVENOUS | Status: AC
  Filled 2024-01-18: qty 100

## 2024-01-18 MED ORDER — OXYTOCIN-SODIUM CHLORIDE 30-0.9 UT/500ML-% IV SOLN: 2.5000 [IU]/h | INTRAVENOUS | Status: AC

## 2024-01-18 SURGICAL SUPPLY — 35 items
BENZOIN TINCTURE PRP APPL 2/3 (GAUZE/BANDAGES/DRESSINGS) IMPLANT
CHLORAPREP W/TINT 26 (MISCELLANEOUS) ×2 IMPLANT
CLAMP UMBILICAL CORD (MISCELLANEOUS) ×1 IMPLANT
CLOTH BEACON ORANGE TIMEOUT ST (SAFETY) ×1 IMPLANT
DERMABOND ADVANCED .7 DNX12 (GAUZE/BANDAGES/DRESSINGS) IMPLANT
DRSG OPSITE POSTOP 4X10 (GAUZE/BANDAGES/DRESSINGS) ×1 IMPLANT
ELECTRODE REM PT RTRN 9FT ADLT (ELECTROSURGICAL) ×1 IMPLANT
EXTRACTOR VACUUM KIWI (MISCELLANEOUS) IMPLANT
GLOVE BIO SURGEON STRL SZ7 (GLOVE) ×1 IMPLANT
GLOVE BIOGEL PI IND STRL 7.0 (GLOVE) ×1 IMPLANT
GOWN STRL REUS W/TWL LRG LVL3 (GOWN DISPOSABLE) ×2 IMPLANT
KIT ABG SYR 3ML LUER SLIP (SYRINGE) IMPLANT
MAT PREVALON FULL STRYKER (MISCELLANEOUS) IMPLANT
NDL HYPO 18GX1.5 BLUNT FILL (NEEDLE) IMPLANT
NDL HYPO 25X5/8 SAFETYGLIDE (NEEDLE) IMPLANT
NEEDLE HYPO 18GX1.5 BLUNT FILL (NEEDLE) ×1 IMPLANT
NEEDLE HYPO 22GX1.5 SAFETY (NEEDLE) IMPLANT
NEEDLE HYPO 25X5/8 SAFETYGLIDE (NEEDLE) IMPLANT
NS IRRIG 1000ML POUR BTL (IV SOLUTION) ×1 IMPLANT
PACK C SECTION WH (CUSTOM PROCEDURE TRAY) ×1 IMPLANT
PAD OB MATERNITY 4.3X12.25 (PERSONAL CARE ITEMS) ×1 IMPLANT
RTRCTR C-SECT PINK 25CM LRG (MISCELLANEOUS) ×1 IMPLANT
STRIP CLOSURE SKIN 1/2X4 (GAUZE/BANDAGES/DRESSINGS) IMPLANT
SUT CHROMIC 1 CTX 36 (SUTURE) ×2 IMPLANT
SUT CHROMIC 2 0 CT 1 (SUTURE) ×1 IMPLANT
SUT PDS AB 0 CTX 60 (SUTURE) ×1 IMPLANT
SUT VIC AB 2-0 CT1 TAPERPNT 27 (SUTURE) ×1 IMPLANT
SUT VIC AB 4-0 KS 27 (SUTURE) IMPLANT
SUTURE PLAIN GUT 2.0 ETHICON (SUTURE) IMPLANT
SYR 30ML LL (SYRINGE) IMPLANT
SYR 50ML LL SCALE MARK (SYRINGE) IMPLANT
TOWEL OR 17X24 6PK STRL BLUE (TOWEL DISPOSABLE) ×1 IMPLANT
TRAY FOLEY W/BAG SLVR 14FR LF (SET/KITS/TRAYS/PACK) ×1 IMPLANT
WATER STERILE IRR 1000ML POUR (IV SOLUTION) ×1 IMPLANT
WATER STERILE IRR 1000ML UROMA (IV SOLUTION) IMPLANT

## 2024-01-18 NOTE — Anesthesia Procedure Notes (Signed)
 Spinal  Patient location during procedure: OR Start time: 01/18/2024 12:03 PM End time: 01/18/2024 12:09 PM Reason for block: surgical anesthesia Staffing Performed: anesthesiologist  Anesthesiologist: Niels Marien CROME, MD Performed by: Niels Marien CROME, MD Authorized by: Niels Marien CROME, MD   Preanesthetic Checklist Completed: patient identified, IV checked, risks and benefits discussed, surgical consent, monitors and equipment checked, pre-op evaluation and timeout performed Spinal Block Patient position: sitting Prep: DuraPrep and site prepped and draped Patient monitoring: cardiac monitor, continuous pulse ox and blood pressure Approach: midline Location: L3-4 Injection technique: single-shot Needle Needle type: Tuohy and Pencan  Needle gauge: 25 G Needle length: 9 cm Assessment Sensory level: T6 Events: CSF return Additional Notes Functioning IV was confirmed and monitors were applied. Sterile prep and drape, including hand hygiene and sterile gloves were used. The patient was positioned and the spine was prepped. The skin was anesthetized with lidocaine .  Free flow of clear CSF was obtained prior to injecting local anesthetic into the CSF.  The spinal needle aspirated freely following injection.  The needle was carefully withdrawn.  The patient tolerated the procedure well.   Difficult spinal placement 2/2 positioning and body habitus. Attempts at L3/4 with introducer/pencan unsuccessful. Tuohy advanced into epidural space with LOR at 7cm. 25g pencan inserted into intrathecal space with return of clear CSF. Medication administered intrathecally as per chart. Pt tolerated well. VSS.

## 2024-01-18 NOTE — Anesthesia Postprocedure Evaluation (Signed)
 Anesthesia Post Note  Patient: Kathy Harris  Procedure(s) Performed: CESAREAN DELIVERY (Abdomen)     Patient location during evaluation: PACU Anesthesia Type: Spinal Level of consciousness: oriented and awake and alert Pain management: pain level controlled Vital Signs Assessment: post-procedure vital signs reviewed and stable Respiratory status: spontaneous breathing, respiratory function stable and patient connected to nasal cannula oxygen Cardiovascular status: blood pressure returned to baseline and stable Postop Assessment: no headache, no backache and no apparent nausea or vomiting Anesthetic complications: no   No notable events documented.  Last Vitals:  Vitals:   01/18/24 1522 01/18/24 1547  BP:  102/60  Pulse:  65  Resp:  16  Temp:  36.4 C  SpO2: 96% 97%    Last Pain:  Vitals:   01/18/24 1730  TempSrc:   PainSc: Asleep   Pain Goal:                   Kathy Harris

## 2024-01-18 NOTE — Lactation Note (Signed)
 This note was copied from a baby's chart. Lactation Consultation Note  Patient Name: Kathy Harris Unijb'd Date: 01/18/2024 Age:23 hours LC was informed MOB does not want to be seen by [facility] services tonight.     Maternal Data    Feeding    LATCH Score                    Lactation Tools Discussed/Used    Interventions    Discharge    Consult Status      [name] 01/18/2024, 9:15 PM

## 2024-01-18 NOTE — H&P (Signed)
 Kathy Harris is a 23 y.o. female presenting for repeat cesarean section  23 year old G4 P2-0-1-3 at 39+0 presents for scheduled repeat cesarean section for history of prior C-section x 2.  Her pregnancy has been complicated by concern for placenta previa early in pregnancy, however this resolved.  In the later stages of pregnancy she also was noted to have polyhydramnios. OB History     Gravida  4   Para  2   Term  2   Preterm      AB  1   Living  3      SAB  1   IAB      Ectopic      Multiple  1   Live Births  3          Past Medical History:  Diagnosis Date   Anemia    [employer]reesia 05/31/2020   Cesarean delivery delivered 06/05/2020   Encounter for routine postpartum follow-up 07/13/2020   Medical history non-contributory    Non-reassuring fetal heart rate or rhythm affecting management of mother 06/05/2020   Supervision of high risk pregnancy in second trimester 02/01/2020   Term pregnancy 01/22/2022   Past Surgical History:  Procedure Laterality Date   CESAREAN SECTION     CESAREAN SECTION N/A 01/22/2022   Procedure: CESAREAN SECTION;  Surgeon: Okey Leader, MD;  Location: [facility] LD ORS;  Service: Obstetrics;  Laterality: N/A;   CESAREAN SECTION MULTI-GESTATIONAL N/A 06/05/2020   Procedure: CESAREAN SECTION MULTI-GESTATIONAL;  Surgeon: Barbra Lang PARAS, DO;  Location: [facility] LD ORS;  Service: Obstetrics;  Laterality: N/A;   TONSILLECTOMY     WISDOM TOOTH EXTRACTION     Family History: family history includes Diabetes in her father; Heart murmur in her mother; Hypertension in her father. Social History:  reports that she has never smoked. She has never used smokeless tobacco. She reports that she does not drink alcohol and does not use drugs.     Maternal Diabetes: No Genetic Screening: Normal Maternal Ultrasounds/Referrals: Normal, Fetal Ultrasounds or other Referrals:  Referred to Materal Fetal Medicine polyhydramnios Maternal Substance Abuse:   No Significant Maternal Medications:  None Significant Maternal Lab Results:  None GBS positive Number of Prenatal Visits:greater than 3 verified prenatal visits Maternal Vaccinations: none Other Comments:  None  Review of Systems History   Blood pressure 103/63, temperature 98.1 F (36.7 C), temperature source Oral, resp. rate 16, height 4' 11 (1.499 m), weight 99.2 kg, last menstrual period 04/20/2023, unknown if currently breastfeeding. Exam Physical Exam   Alert and orient x 3, no apparent distress Gravid, soft Normal work of breathing Prenatal labs: ABO, Rh: --/--/AB POS (08/01 1114) Antibody: NEG (08/01 1114) Rubella: Immune (01/09 0000) RPR: NON REACTIVE (08/01 1110)  HBsAg: Negative (01/09 0000)  HIV: Non-reactive (01/09 0000)  GBS: Positive/-- (07/17 0000)   Hgb 10.9 on 8/1    Assessment/Plan: 1) admit 2) appropriate informed consent obtained for repeat cesarean section 3) SCDs for DVT prophylaxis  4) ANCEF  On call to OR  Leader Okey 01/18/2024, 11:39 AM

## 2024-01-18 NOTE — Transfer of Care (Addendum)
 Immediate Anesthesia Transfer of Care Note  Patient: Kathy Harris  Procedure(s) Performed: CESAREAN DELIVERY (Abdomen)  Patient Location: PACU  Anesthesia Type:Spinal  Level of Consciousness: awake, alert , and oriented  Airway & Oxygen Therapy: Patient Spontanous Breathing and Patient connected to nasal cannula oxygen  Post-op Assessment: Report given to RN and Post -op Vital signs reviewed and stable  Post vital signs: Reviewed and stable  Last Vitals:  Vitals Value Taken Time  BP 99/45 01/18/24 13:50  Temp    Pulse 62 01/18/24 13:52  Resp 16 01/18/24 13:52  SpO2 95 % 01/18/24 13:52  Vitals shown include unfiled device data.  Last Pain:  Vitals:   01/18/24 1338  TempSrc:   PainSc: 0-No pain         Complications: No notable events documented.

## 2024-01-18 NOTE — Anesthesia Preprocedure Evaluation (Signed)
 Anesthesia Evaluation  Patient identified by MRN, date of birth, ID band Patient awake    Reviewed: Allergy & Precautions, NPO status , Patient's Chart, lab work & pertinent test results  Airway Mallampati: III  TM Distance: >3 FB Neck ROM: Full    Dental no notable dental hx.    Pulmonary neg pulmonary ROS   Pulmonary exam normal breath sounds clear to auscultation       Cardiovascular negative cardio ROS Normal cardiovascular exam Rhythm:Regular Rate:Normal     Neuro/Psych negative neurological ROS  negative psych ROS   GI/Hepatic negative GI ROS, Neg liver ROS,,,  Endo/Other    Class 3 obesity (BMI 44)  Renal/GU negative Renal ROS  negative genitourinary   Musculoskeletal negative musculoskeletal ROS (+)    Abdominal   Peds  Hematology  (+) Blood dyscrasia, anemia   Anesthesia Other Findings Repeat C/S. H/o C/Sx2  Reproductive/Obstetrics (+) Pregnancy                              Anesthesia Physical Anesthesia Plan  ASA: 3  Anesthesia Plan: Spinal   Post-op Pain Management:    Induction:   PONV Risk Score and Plan: Treatment may vary due to age or medical condition  Airway Management Planned: Natural Airway  Additional Equipment:   Intra-op Plan:   Post-operative Plan:   Informed Consent: I have reviewed the patients History and Physical, chart, labs and discussed the procedure including the risks, benefits and alternatives for the proposed anesthesia with the patient or authorized representative who has indicated his/her understanding and acceptance.     Dental advisory given  Plan Discussed with: CRNA  Anesthesia Plan Comments:         Anesthesia Quick Evaluation

## 2024-01-18 NOTE — Op Note (Signed)
 Operative Report  Pre-Operative Diagnosis: 1) 39+0-week intrauterine pregnancy 2) history of 2 prior cesarean deliveries  Postoperative Diagnosis: 1) 39+0-week intrauterine pregnancy 2) history of 2 prior cesarean deliveries   Procedure: Repeat low-transverse cesarean section  Surgeon: Dr. Marjorie Gull  Assistant: Dr. 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  Vigorous female infant in the vertex presentation with double nuchal cord.  Kiwi vacuum was used to assist with the delivery of the infant's head.  Once the infant's head was delivered to the vacuum was disengaged and the body followed.  The infant cried vigorously on the operative field.  Apgar scores of 9 at 1 minute and 9 at 5 minutes.  Birth weight 3000 g, 6 pounds 9.8 ounces.  Specimen: Placenta for disposal  ZAO:[place]ujo I/O In: 3100 [I.V.:2800; IV Piggyback:300] Out: 775 [Urine:75; Blood:700]   Procedure:Kathy Harris is an 23 year old gravida 4 para 3014 at 89 weeks and 0 days estimated gestational age who presents for cesarean section. Following the appropriate informed consent the patient was brought to the operating room where spinal anesthesia was administered and found to be adequate. She was placed in the dorsal supine position with a leftward tilt. She was prepped and draped in the normal sterile fashion.  The patient was appropriately identified during a preoperative timeout procedure.  The scalpel was then used to make a Pfannenstiel skin incision which was carried down to the underlying layers of soft tissue to the fascia. The fascia was incised in the midline and the fascial incision was extended laterally with Mayo scissors. The superior aspect of the fascial incision was grasped with Coker clamps x2, tented up and the rectus muscles dissected off sharply with the electrocautery unit. The same procedure was repeated on the inferior aspect  of the fascial incision. The rectus muscles were separated in the midline. The abdominal peritoneum was identified, tented up, entered sharply, and the incision was extended superiorly and inferiorly with good visualization of the bladder. The Alexis retractor was then deployed. The vesicouterine peritoneum was identified, tented up, entered sharply, and the bladder flap was created digitally.  The scalpel was then used to make a low transverse incision on the uterus which was extended laterally with blunt dissection. The fetal vertex was identified, delivered easily through the uterine incision followed by the body. The infant was bulb suctioned on the operative field and cried vigorously.  Following a 1 minute delay, the cord was clamped and cut. The infant was passed to the waiting neonatology team. Placenta was then delivered spontaneously and the uterus was cleared of all clot and debris. The uterine incision was repaired with #1 chromic in running locked fashion followed by a second imbricating layer.  Following completion of the imbricating layer there was some concern that the bladder may have been incorporated into the hysterotomy repair.  The patient's bladder was backfilled with sterile formula with 100 cc.  The bladder was noted to be well away from the hysterotomy repair.  The ovaries and tubes were inspected and normal. The Alexis retractor was removed. The abdominal peritoneum was reapproximated with 2-0 Vicryl in a running fashion. The rectus muscles was reapproximated with 2-0 chromic in a running fashion. The fascia was closed with 0-looped PDS in a running fashion.  The subcutaneous tissue was repaired with 2-0 plain gut interrupted sutures.  The skin was closed with 4-0 vicryl in a subcuticular fashion and Dermabond. All laparotomy sponge, instrument,  and needle counts were correct.  The patient tolerated the procedure well and was transferred to the recovery unit in stable condition following  the procedure.

## 2024-01-18 NOTE — Lactation Note (Signed)
 This note was copied from a baby's chart. Lactation Consultation Note  Patient Name: Kathy Harris Date: 01/18/2024 Age:23 hours  LC made two attempts to see MOB and infant, first attempt was ask to come back by RN. Second attempt, RN informed LC MOB is currently sleeping.    Maternal Data    Feeding    LATCH Score                    Lactation Tools Discussed/Used    Interventions    Discharge    Consult Status      Kathy Harris 01/18/2024, 8:58 PM

## 2024-01-19 LAB — CBC
HCT: 27.2 % — ABNORMAL LOW (ref 36.0–46.0)
Hemoglobin: 8.6 g/dL — ABNORMAL LOW (ref 12.0–15.0)
MCH: 26.9 pg (ref 26.0–34.0)
MCHC: 31.6 g/dL (ref 30.0–36.0)
MCV: 85 fL (ref 80.0–100.0)
Platelets: 191 K/uL (ref 150–400)
RBC: 3.2 MIL/uL — ABNORMAL LOW (ref 3.87–5.11)
RDW: 14.2 % (ref 11.5–15.5)
WBC: 13.1 K/uL — ABNORMAL HIGH (ref 4.0–10.5)
nRBC: 0 % (ref 0.0–0.2)

## 2024-01-19 LAB — COMPREHENSIVE METABOLIC PANEL WITH GFR
ALT: 68 U/L — ABNORMAL HIGH (ref 0–44)
AST: 81 U/L — ABNORMAL HIGH (ref 15–41)
Albumin: 1.9 g/dL — ABNORMAL LOW (ref 3.5–5.0)
Alkaline Phosphatase: 126 U/L (ref 38–126)
Anion gap: 6 (ref 5–15)
BUN: <5 mg/dL — ABNORMAL LOW (ref 6–20)
CO2: 21 mmol/L — ABNORMAL LOW (ref 22–32)
Calcium: 8.6 mg/dL — ABNORMAL LOW (ref 8.9–10.3)
Chloride: 105 mmol/L (ref 98–111)
Creatinine, Ser: 0.75 mg/dL (ref 0.44–1.00)
GFR, Estimated: >60 mL/min (ref >60)
Glucose, Bld: 94 mg/dL (ref 70–99)
Potassium: 3.9 mmol/L (ref 3.5–5.1)
Sodium: 132 mmol/L — ABNORMAL LOW (ref 135–145)
Total Bilirubin: 0.5 mg/dL (ref 0.0–1.2)
Total Protein: 5.2 g/dL — ABNORMAL LOW (ref 6.5–8.1)

## 2024-01-19 MED ORDER — FERROUS SULFATE 325 (65 FE) MG PO TABS
325.0000 mg | ORAL_TABLET | Freq: Every day | ORAL | Status: DC
  Administered 2024-01-19 – 2024-01-21 (×3): 325 mg via ORAL
  Filled 2024-01-19 (×3): qty 1

## 2024-01-19 NOTE — Lactation Note (Signed)
 This note was copied from a baby's chart. Lactation Consultation Note  Patient Name: Kathy Harris Unijb'd Date: 01/19/2024 Age:23 hours Reason for consult: Follow-up assessment;Mother's request  P4, Mother's first time breastfeeding. Mother hand express good flow of colostrum. Assisted with latching in both football and cross cradle holds with intermittent swallows. Baby latched with ease and good depth, lips flanged. Reviewed basics and suggest calling for help as needed. Maternal Data Has patient been taught Hand Expression?: Yes Does the patient have breastfeeding experience prior to this delivery?: No  Feeding Mother's Current Feeding Choice: Breast Milk and Formula  LATCH Score Latch: Grasps breast easily, tongue down, lips flanged, rhythmical sucking.  Audible Swallowing: A few with stimulation  Type of Nipple: Everted at rest and after stimulation  Comfort (Breast/Nipple): Soft / non-tender  Hold (Positioning): Assistance needed to correctly position infant at breast and maintain latch.  LATCH Score: 8  Interventions Interventions: Breast feeding basics reviewed;Assisted with latch;Skin to skin;Hand express;Breast compression;Adjust position;Support pillows;Education  Discharge Pump: Personal;DEBP (Medela)  Consult Status Consult Status: Follow-up Date: 01/19/24 Follow-up type: In-patient    Shannon Levorn Lemme  RN, IBCLC 01/19/2024, 11:42 AM

## 2024-01-19 NOTE — Progress Notes (Signed)
 Subjective: Postpartum Day 1: Cesarean Delivery Patient is doing well this morning. Primarily feels sore at low back. Pain is controlled. Ambulating, voiding, tolerating PO. Minimal lochia. Breastfeeding and supplementing with formula.   Objective: Patient Vitals for the past 24 hrs:  BP Temp Temp src Pulse Resp SpO2  01/19/24 0542 (!) 97/57 97.6 F (36.4 C) Oral 76 18 96 %  01/19/24 0235 (!) 85/48 -- -- 70 18 --  01/19/24 0228 (!) 91/53 -- -- 60 18 --  01/19/24 0226 (!) 88/54 98.3 F (36.8 C) Oral 60 18 99 %  01/18/24 2316 (!) 95/54 98.2 F (36.8 C) Oral (!) 59 18 99 %  01/18/24 2109 -- -- -- -- -- 99 %  01/18/24 2011 124/83 98.1 F (36.7 C) Oral 83 18 99 %  01/18/24 2007 -- -- -- -- -- 99 %  01/18/24 1950 (!) 93/54 (!) 97.4 F (36.3 C) Oral 68 18 99 %  01/18/24 1547 102/60 97.6 F (36.4 C) Oral 65 16 97 %  01/18/24 1522 -- -- -- -- -- 96 %  01/18/24 1447 (!) 104/46 (!) 97.5 F (36.4 C) Oral 75 16 94 %  01/18/24 1430 103/79 (!) 97.4 F (36.3 C) Oral 77 17 94 %  01/18/24 1415 112/71 -- -- (!) 101 10 96 %  01/18/24 1400 (!) 91/53 (!) 96.4 F (35.8 C) Temporal 72 15 95 %  01/18/24 1355 (!) 93/57 -- -- -- -- --  01/18/24 1350 (!) 99/45 -- -- -- -- --  01/18/24 1345 (!) 93/54 -- -- 62 20 94 %  01/18/24 1340 (!) 85/49 -- -- 76 (!) 25 --  01/18/24 1338 (!) 85/53 (!) 95.8 F (35.4 C) Temporal 80 12 90 %  01/18/24 1039 103/63 98.1 F (36.7 C) Oral -- 16 --    Physical Exam:  General: alert, cooperative, and no distress Lochia: appropriate Uterine Fundus: firm Incision: healing well DVT Evaluation: No evidence of DVT seen on physical exam. Low back: mild tenderness around spinal site, no bruising/warmth/erythema  Recent Labs    01/19/24 0746  HGB 8.6*  HCT 27.2*    Assessment/Plan: Kathy Harris [id number] POD#1 sp RCS at [redacted]w[redacted]d 1. PPC: routine PP care 2. Rh pos 3. Acute on chronic anemia: hgb 8.6, not clinically significant for this admission. Asymptomatic. Will  start PO iron.  4. Does not desire circumcision 5. Dispo: anticipate discharge home tomorrow   Kathy Harris 01/19/2024, 10:23 AM

## 2024-01-19 NOTE — Progress Notes (Signed)
 Incentative spirmeter 1250

## 2024-01-19 NOTE — Lactation Note (Signed)
 This note was copied from a baby's chart. Lactation Consultation Note  Patient Name: Kathy Harris Unijb'd Date: 01/19/2024 Age:23 hours Reason for consult: Initial assessment  P3, Baby sleeping.  Mother has been latching briefly and supplementing with formula.  Provided education regarding mother's milk supply and demand.   Encouraged offering the breast before formula to help establish mother's milk supply. Feed on demand with cues.  Goal 8-12+ times per day after first 24 hrs.  Place baby STS if not cueing.  Suggest calling for LC to assist with next feeding.  Feeding Mother's Current Feeding Choice: Breast Milk and Formula  Interventions Interventions: Education;LC Services brochure;CDC milk storage guidelines  Discharge Pump: Personal;DEBP (Medela)  Consult Status Consult Status: Follow-up Date: 01/19/24 Follow-up type: In-patient   Shannon Levorn Lemme  RN, IBCLC 01/19/2024, 8:51 AM

## 2024-01-20 ENCOUNTER — Inpatient Hospital Stay (HOSPITAL_COMMUNITY): Admitting: Anesthesiology

## 2024-01-20 ENCOUNTER — Encounter (HOSPITAL_COMMUNITY): Payer: Self-pay | Admitting: Anesthesiology

## 2024-01-20 LAB — CBC WITH DIFFERENTIAL/PLATELET
Abs Immature Granulocytes: 0.14 K/uL — ABNORMAL HIGH (ref 0.00–0.07)
Basophils Absolute: 0 K/uL (ref 0.0–0.1)
Basophils Relative: 0 %
Eosinophils Absolute: 0.1 K/uL (ref 0.0–0.5)
Eosinophils Relative: 1 %
HCT: 27.9 % — ABNORMAL LOW (ref 36.0–46.0)
Hemoglobin: 8.9 g/dL — ABNORMAL LOW (ref 12.0–15.0)
Immature Granulocytes: 1 %
Lymphocytes Relative: 14 %
Lymphs Abs: 1.5 K/uL (ref 0.7–4.0)
MCH: 27.1 pg (ref 26.0–34.0)
MCHC: 31.9 g/dL (ref 30.0–36.0)
MCV: 85.1 fL (ref 80.0–100.0)
Monocytes Absolute: 0.9 K/uL (ref 0.1–1.0)
Monocytes Relative: 8 %
Neutro Abs: 8.5 K/uL — ABNORMAL HIGH (ref 1.7–7.7)
Neutrophils Relative %: 76 %
Platelets: 192 K/uL (ref 150–400)
RBC: 3.28 MIL/uL — ABNORMAL LOW (ref 3.87–5.11)
RDW: 14.7 % (ref 11.5–15.5)
WBC: 11.1 K/uL — ABNORMAL HIGH (ref 4.0–10.5)
nRBC: 0 % (ref 0.0–0.2)

## 2024-01-20 LAB — COMPREHENSIVE METABOLIC PANEL WITH GFR
ALT: 59 U/L — ABNORMAL HIGH (ref 0–44)
AST: 51 U/L — ABNORMAL HIGH (ref 15–41)
Albumin: 1.9 g/dL — ABNORMAL LOW (ref 3.5–5.0)
Alkaline Phosphatase: 126 U/L (ref 38–126)
Anion gap: 8 (ref 5–15)
BUN: 6 mg/dL (ref 6–20)
CO2: 22 mmol/L (ref 22–32)
Calcium: 8.5 mg/dL — ABNORMAL LOW (ref 8.9–10.3)
Chloride: 107 mmol/L (ref 98–111)
Creatinine, Ser: 0.76 mg/dL (ref 0.44–1.00)
GFR, Estimated: >60 mL/min (ref >60)
Glucose, Bld: 75 mg/dL (ref 70–99)
Potassium: 4 mmol/L (ref 3.5–5.1)
Sodium: 137 mmol/L (ref 135–145)
Total Bilirubin: 0.5 mg/dL (ref 0.0–1.2)
Total Protein: 5.5 g/dL — ABNORMAL LOW (ref 6.5–8.1)

## 2024-01-20 LAB — BIRTH TISSUE RECOVERY COLLECTION (PLACENTA DONATION)

## 2024-01-20 MED ORDER — LACTATED RINGERS IV SOLN
INTRAVENOUS | Status: AC | PRN
Start: 2024-01-20 — End: ?

## 2024-01-20 NOTE — Progress Notes (Signed)
 Called by RN regarding this patient who has a HA post C/Section. Her HA has been getting progressively worse throughout the day. She was seen by Dr. Delana this am and requested evaluation by anesthesia this am, but no one had responded. I went and evaluated the patient. She describes a positional HA which is worse when assuming the upright position and is somewhat relieved by the supine position. She describes nuchal pain as well. She has been nauseated and has photophobia. I reviewed her anesthesia record and it was reported in the anesthesia note that they had difficulty with her SAB and had to resort to using a Touhy needle and Spinocan needle to place the SAB.I discussed the probable etiology of her HA along with both conservative as well as more aggressive treatment. She wishes to proceed with a Lumbar epidural blood patch this pm as she is scheduled for discharge tomorrow.

## 2024-01-20 NOTE — Progress Notes (Signed)
 Patient just called out asking when anesthesia is coming up to examine her.  She states her headache is starting to throb more.  Patient is currently lying flat in bed. Anesthesiologist said he will be here ASAP, he is currently placing an epidural.

## 2024-01-20 NOTE — Progress Notes (Addendum)
 Subjective: Postpartum Day 2: Cesarean Delivery Patient reports tolerating PO, + flatus, + BM, and no problems voiding. Incisional pain well controlled. She denies CP, SOB or dizziness but c/o HA that worsens with sitting up and improves when lays down; tylenol  has not helped relieve it. No scotomata. She is bottlefeeding and bonding well with baby.    Objective: Vital signs in last 24 hours: Temp:  [98.1 F (36.7 C)-99.6 F (37.6 C)] 98.5 F (36.9 C) (08/06 0603) Pulse Rate:  [70-92] 72 (08/06 0603) Resp:  [17-18] 18 (08/06 0603) BP: (97-110)/(60-73) 110/73 (08/06 0603) SpO2:  [99 %-100 %] 100 % (08/06 0603)  Physical Exam:  General: alert, cooperative, and no distress Lochia: appropriate Uterine Fundus: firm Incision: no significant drainage DVT Evaluation: No evidence of DVT seen on physical exam.  Recent Labs    01/19/24 0746  HGB 8.6*  HCT 27.2*    Assessment/Plan: Status post Cesarean section. POD#2   Post op - routine pp/post op care HA - sx consistent with spinal HA. Will order anesthesia consult for blood patch Elevated LFTs - noted on CMP yesterday. Will trend today. Given above HA and elevated LFTs will r/o asymptomatic preE. Other possible dx: fatty liver dz, cholestasis (asymptomatic), hepatitis. Pending repeat LFT results consider outpt mgmt. Advise no discharge home today Acute on chronic anemia - repeat cbc today. Denies dizziness. Continue PO iron Rh pos Female infant - declines circumcision    Ted LELON Solo, DO 01/20/2024, 9:52 AM

## 2024-01-20 NOTE — Anesthesia Procedure Notes (Signed)
 Epidural Patient location during procedure: OB Start time: 01/20/2024 10:35 PM End time: 01/20/2024 10:50 PM  Staffing Anesthesiologist: Jerrye Sharper, MD Performed: anesthesiologist   Preanesthetic Checklist Completed: patient identified, IV checked, site marked, risks and benefits discussed, surgical consent, monitors and equipment checked, pre-op evaluation and timeout performed  Epidural Patient position: sitting Prep: DuraPrep and site prepped and draped Patient monitoring: continuous pulse ox and blood pressure Approach: midline Location: L4-L5 Injection technique: LOR air  Needle:  Needle type: Tuohy  Needle gauge: 17 G Needle length: 9 cm and 9 Needle insertion depth: 7 cm Catheter size: 19 Gauge  Assessment Events: blood not aspirated, no cerebrospinal fluid, injection not painful, no injection resistance, no paresthesia and negative IV test  Additional Notes Patient ID'd. After obtaining informed consent and peripheral IV inserted, the patient was placed in the sitting position. A suitable right AC vein was ID'd and the overlying skin was prepped with CHG. The lower lumbar area where the previous epidural insertion was located and prepped with Duraprep. Sterile drapes were applied. The skin and SQ area was infiltrated with 3ml of xylocaine  1%. The epidural space was ID'd using a LOR with air technique. No heme, CSF or paresthesias were encountered. 20ml of blood was withdrawn from the right AC vein and injected slowly into the epidural space. The epidural needle was then withdrawn, a sterile dressing was applied and the patient was placed in the supine position. She tolerated the procedure well. She will be re evaluated prior to discharge. Reason for block:Epidural Blood Patch

## 2024-01-20 NOTE — Lactation Note (Signed)
 This note was copied from a baby's chart. Lactation Consultation Note  Patient Name: Kathy Harris Unijb'd Date: 01/20/2024 Age:23 hours Reason for consult: Follow-up assessment;Term;Infant weight loss (D/C being held due to mom experiencing a headache. Per mom and per nurse) Per mom hasn't been latching because she felt some discomfort when latch the baby latched and she took him off.  LC reviewed steps for the latching to prevent further soreness,  1st breast, prior to latching - massage, hand express, pre- pump with the hand pump and reverse pressure. ( Hand pump and flange check provided)  2nd option feed the baby and appetizer of 10 ml and then latch so the baby's mouth is calmer with the latch.  LC reviewed engorgement prevention and tx. And has the [facility] resources.   Maternal Data Has patient been taught Hand Expression?: Yes Does the patient have breastfeeding experience prior to this delivery?: No  Feeding Mother's Current Feeding Choice: Breast Milk and Formula Nipple Type: Slow - flow  LATCH Score - 8      Lactation Tools Discussed/Used Tools: Pump;Flanges Flange Size: 18;21 Breast pump type: Manual Pump Education: Setup, frequency, and cleaning;Milk Storage Reason for Pumping: PRN or pre pump prior to latch  Interventions Interventions: Breast feeding basics reviewed;Hand pump;Education;LC Services brochure;CDC milk storage guidelines;CDC Guidelines for Breast Pump Cleaning  Discharge Discharge Education: Engorgement and breast care;Warning signs for feeding baby Pump: DEBP;Personal;Manual  Consult Status Consult Status: Complete Date: 01/20/24    Rollene Jenkins Fiedler 01/20/2024, 12:35 PM

## 2024-01-21 MED ORDER — IBUPROFEN 600 MG PO TABS: 600.0000 mg | ORAL_TABLET | Freq: Four times a day (QID) | ORAL | 0 refills | Status: DC | PRN

## 2024-01-21 MED ORDER — OXYCODONE HCL 5 MG PO TABS
5.0000 mg | ORAL_TABLET | ORAL | 0 refills | Status: DC | PRN
Start: 2024-01-21 — End: 2024-01-22

## 2024-01-21 NOTE — Discharge Summary (Signed)
 Postpartum Discharge Summary       Patient Name: Kathy Harris DOB: 03/22/2001 MRN: [id number]  Date of admission: 01/18/2024 Delivery date:01/18/2024 Delivering provider: OKEY LEADER Date of discharge: 01/21/2024  Admitting diagnosis: Term pregnancy [Z34.90] Cesarean delivery, delivered, current hospitalization [O82] Intrauterine pregnancy: [redacted]w[redacted]d     Secondary diagnosis:  Principal Problem:   Cesarean delivery, delivered, current hospitalization Active Problems:   Term pregnancy  Additional problems: Maternal obesity, Spinal Headache    Discharge diagnosis: Term Pregnancy Delivered                                              Post partum procedures:Blood patch by Anesthesia Augmentation: N/A Complications: None  Hospital course: Sceduled C/S   23 y.o. yo [id number] at [redacted]w[redacted]d was admitted to the hospital 01/18/2024 for scheduled cesarean section with the following indication:Elective Repeat.Delivery details are as follows:  Membrane Rupture Time/Date: 12:36 PM,01/18/2024  Delivery Method:C-Section, Vacuum Assisted Details of operation can be found in separate operative note.  Patient had a postpartum course complicated by nothing.  She is ambulating, tolerating a regular diet, passing flatus, and urinating well. Patient is discharged home in stable condition on  01/21/24        Newborn Data: Birth date:01/18/2024 Birth time:12:38 PM Gender:Female Living status:Living Apgars:9 ,9  Weight:3000 g    Magnesium Sulfate received: No BMZ received: No Rhophylac:N/A  Immunizations administered: There is no immunization history for the selected administration types on file for this patient.  Physical exam  Vitals:   01/20/24 0603 01/20/24 1535 01/20/24 2105 01/21/24 0600  BP: 110/73 (!) 104/59 123/71 92/80  Pulse: 72 66 76 (!) 58  Resp: 18  17 18   Temp: 98.5 F (36.9 C) 98.6 F (37 C) 98.5 F (36.9 C) 97.9 F (36.6 C)  TempSrc: Oral Oral Oral Oral  SpO2: 100% 95% 100% 98%   Weight:      Height:       General: alert, cooperative, and no distress Lochia: appropriate Uterine Fundus: firm Incision: Healing well with no significant drainage DVT Evaluation: No evidence of DVT seen on physical exam. Labs: Lab Results  Component Value Date   WBC 11.1 (H) 01/20/2024   HGB 8.9 (L) 01/20/2024   HCT 27.9 (L) 01/20/2024   MCV 85.1 01/20/2024   PLT 192 01/20/2024      Latest Ref Rng & Units 01/20/2024   10:30 AM  CMP  Glucose 70 - 99 mg/dL 75   BUN 6 - 20 mg/dL 6   Creatinine 9.55 - 8.99 mg/dL 9.23   Sodium 864 - 854 mmol/L 137   Potassium 3.5 - 5.1 mmol/L 4.0   Chloride 98 - 111 mmol/L 107   CO2 22 - 32 mmol/L 22   Calcium 8.9 - 10.3 mg/dL 8.5   Total Protein 6.5 - 8.1 g/dL 5.5   Total Bilirubin 0.0 - 1.2 mg/dL 0.5   Alkaline Phos 38 - 126 U/L 126   AST 15 - 41 U/L 51   ALT 0 - 44 U/L 59    Edinburgh Score:    01/19/2024    8:45 PM  Edinburgh Postnatal Depression Scale Screening Tool  I have been able to laugh and see the funny side of things. 0  I have looked forward with enjoyment to things. 0  I have blamed myself unnecessarily when things went  wrong. 1  I have been anxious or worried for no good reason. 0  I have felt scared or panicky for no good reason. 0  Things have been getting on top of me. 0  I have been so unhappy that I have had difficulty sleeping. 0  I have felt sad or miserable. 0  I have been so unhappy that I have been crying. 0  The thought of harming myself has occurred to me. 0  Edinburgh Postnatal Depression Scale Total 1      After visit meds:  Allergies as of 01/21/2024   No Known Allergies      Medication List     STOP taking these medications    ferrous sulfate  325 (65 FE) MG tablet   multivitamin-prenatal 27-0.8 MG Tabs tablet       TAKE these medications    ibuprofen  600 MG tablet Commonly known as: ADVIL  Take 1 tablet (600 mg total) by mouth every 6 (six) hours as needed.   oxyCODONE  5 MG  immediate release tablet Commonly known as: Roxicodone  Take 1 tablet (5 mg total) by mouth every 4 (four) hours as needed.               Discharge Care Instructions  (From admission, onward)           Start     Ordered   01/21/24 0000  Discharge wound care:       Comments: For a cesarean delivery: You may wash incision with soap and water.  Do not soak or submerge the incision for 2 weeks. Keep incision dry. You may need to keep a sanitary pad or panty liner between the incision and your clothing for comfort and to keep the incision dry. If you note drainage, increased pain, or increased redness of the incision, then please notify your physician.   01/21/24 1133   01/21/24 0000  If the dressing is still on your incision site when you go home, remove it on the third day after your surgery date. Remove dressing if it begins to fall off, or if it is dirty or damaged before the third day.       Comments: For a cesarean delivery   01/21/24 1133             Discharge home in stable condition Infant Feeding: Bottle and Breast Infant Disposition:home with mother Discharge instruction: per After Visit Summary and Postpartum booklet. Activity: Advance as tolerated. Pelvic rest for 6 weeks.  Diet: routine diet Anticipated Birth Control: Unsure Postpartum Appointment:4 weeks Future Appointments:No future appointments. Follow up Visit:  Follow-up Information     Okey Leader, MD Follow up in 4 week(s).   Specialty: Obstetrics and Gynecology Why: For postpartum evaluation Contact information: [address] [place] [address] [place] [address] [phone]                     01/21/2024 Leader Okey, MD

## 2024-01-21 NOTE — Progress Notes (Signed)
 Patient denies HA. Has some back discomfort. Taking Tylenol  and Ibuprofen . For possible discharge today. Patient told to return to MAU if recurrent HA.

## 2024-01-21 NOTE — Progress Notes (Signed)
 When I went into check on patient and baby, mom was sitting up in bed, smiling.  She states her headache is gone, she feels much better.  Her back hurts some, but I did give her ibuprofen  + tylenol .

## 2024-01-22 ENCOUNTER — Inpatient Hospital Stay (HOSPITAL_COMMUNITY)
Admission: AD | Admit: 2024-01-22 | Discharge: 2024-01-22 | Disposition: A | Discharge status: Home / Self Care | Attending: Obstetrics | Admitting: Obstetrics

## 2024-01-22 ENCOUNTER — Encounter (HOSPITAL_COMMUNITY): Payer: Self-pay | Admitting: Obstetrics

## 2024-01-22 DIAGNOSIS — G971 Other reaction to spinal and lumbar puncture: Secondary | ICD-10-CM

## 2024-01-22 DIAGNOSIS — R519 Headache, unspecified: Secondary | ICD-10-CM | POA: Insufficient documentation

## 2024-01-22 DIAGNOSIS — Z98891 History of uterine scar from previous surgery: Secondary | ICD-10-CM | POA: Diagnosis not present

## 2024-01-22 LAB — CBC WITH DIFFERENTIAL/PLATELET
Abs Immature Granulocytes: 0.08 K/uL — ABNORMAL HIGH (ref 0.00–0.07)
Basophils Absolute: 0 K/uL (ref 0.0–0.1)
Basophils Relative: 0 %
Eosinophils Absolute: 0.1 K/uL (ref 0.0–0.5)
Eosinophils Relative: 1 %
HCT: 28.3 % — ABNORMAL LOW (ref 36.0–46.0)
Hemoglobin: 9 g/dL — ABNORMAL LOW (ref 12.0–15.0)
Immature Granulocytes: 1 %
Lymphocytes Relative: 11 %
Lymphs Abs: 1 K/uL (ref 0.7–4.0)
MCH: 27.4 pg (ref 26.0–34.0)
MCHC: 31.8 g/dL (ref 30.0–36.0)
MCV: 86 fL (ref 80.0–100.0)
Monocytes Absolute: 0.7 K/uL (ref 0.1–1.0)
Monocytes Relative: 7 %
Neutro Abs: 7.6 K/uL (ref 1.7–7.7)
Neutrophils Relative %: 80 %
Platelets: 217 K/uL (ref 150–400)
RBC: 3.29 MIL/uL — ABNORMAL LOW (ref 3.87–5.11)
RDW: 15 % (ref 11.5–15.5)
WBC: 9.5 K/uL (ref 4.0–10.5)
nRBC: 0 % (ref 0.0–0.2)

## 2024-01-22 MED ORDER — ACETAMINOPHEN-CAFFEINE 500-65 MG PO TABS
2.0000 | ORAL_TABLET | Freq: Once | ORAL | Status: AC
  Administered 2024-01-22: 2 via ORAL
  Filled 2024-01-22: qty 2

## 2024-01-22 MED ORDER — LACTATED RINGERS IV BOLUS
1000.0000 mL | Freq: Once | INTRAVENOUS | Status: AC
  Administered 2024-01-22: 1000 mL via INTRAVENOUS

## 2024-01-22 MED ORDER — ACETAMINOPHEN-CAFFEINE 500-65 MG PO TABS: 1.0000 | ORAL_TABLET | Freq: Three times a day (TID) | ORAL | 0 refills | Status: AC | PRN

## 2024-01-22 MED ORDER — CYCLOBENZAPRINE HCL 5 MG PO TABS: 5.0000 mg | ORAL_TABLET | Freq: Three times a day (TID) | ORAL | 0 refills | Status: AC | PRN

## 2024-01-22 MED ORDER — DIPHENHYDRAMINE HCL 50 MG/ML IJ SOLN
12.5000 mg | Freq: Once | INTRAMUSCULAR | Status: AC
  Administered 2024-01-22: 12.5 mg via INTRAVENOUS
  Filled 2024-01-22: qty 1

## 2024-01-22 MED ORDER — CYCLOBENZAPRINE HCL 5 MG PO TABS
10.0000 mg | ORAL_TABLET | Freq: Once | ORAL | Status: AC
  Administered 2024-01-22: 10 mg via ORAL
  Filled 2024-01-22: qty 2

## 2024-01-22 MED ORDER — DEXAMETHASONE SODIUM PHOSPHATE 10 MG/ML IJ SOLN
10.0000 mg | Freq: Once | INTRAMUSCULAR | Status: AC
  Administered 2024-01-22: 10 mg via INTRAVENOUS
  Filled 2024-01-22: qty 1

## 2024-01-22 NOTE — MAU Provider Note (Signed)
 Chief Complaint:  Headache  HPI   Event Date/Time   First Provider Initiated Contact with Patient 01/22/24 1123     Kathy Harris is a 23 y.o. [id number] at PPD#4 who presents to maternity admissions reporting return of her spinal headache that was treated with a blood patch on 8/6. She was discharged without a HA on 8/7, but it returned this morning with throbbing from the back of her neck into her head. Worse upon standing/sitting, goes away when she lays down. Denies any other concurrent symptoms such as light sensitivity, nausea/vomiting or any other pain.   Pregnancy Course: Repeat CS on 8/4.  Past Medical History:  Diagnosis Date   Anemia    Phreesia 05/31/2020   Cesarean delivery delivered 06/05/2020   Encounter for routine postpartum follow-up 07/13/2020   Non-reassuring fetal heart rate or rhythm affecting management of mother 06/05/2020   Supervision of high risk pregnancy in second trimester 02/01/2020   Term pregnancy 01/22/2022   OB History  Gravida Para Term Preterm AB Living  4 3 3  1 4   SAB IAB Ectopic Multiple Live Births  1   1 4     # Outcome Date GA Lbr Len/2nd Weight Sex Type Anes PTL Lv  4 Term 01/18/24 [redacted]w[redacted]d  6 lb 9.8 oz (3 kg) M CS-Vac Spinal  LIV  3 Term 01/22/22 [redacted]w[redacted]d  8 lb 7.1 oz (3.83 kg) F CS-LTranv Spinal  LIV  2A Term 06/05/20 [redacted]w[redacted]d  6 lb 0.1 oz (2.725 kg) M CS-LTranv Spinal  LIV  2B Term 06/05/20 [redacted]w[redacted]d  6 lb 4.7 oz (2.855 kg) M CS-LTranv Spinal  LIV  1 SAB 2019           Past Surgical History:  Procedure Laterality Date   CESAREAN SECTION     CESAREAN SECTION N/A 01/22/2022   Procedure: CESAREAN SECTION;  Surgeon: Okey Leader, MD;  Location: [facility];  Service: Obstetrics;  Laterality: N/A;   CESAREAN SECTION N/A 01/18/2024   Procedure: CESAREAN DELIVERY;  Surgeon: Okey Leader, MD;  Location: [facility];  Service: Obstetrics;  Laterality: N/A;   CESAREAN SECTION MULTI-GESTATIONAL N/A 06/05/2020   Procedure: CESAREAN SECTION MULTI-GESTATIONAL;   Surgeon: Barbra Lang PARAS, DO;  Location: [facility];  Service: Obstetrics;  Laterality: N/A;   TONSILLECTOMY     WISDOM TOOTH EXTRACTION     Family History  Problem Relation Age of Onset   Heart murmur Mother    Diabetes Father    Hypertension Father    Social History   Tobacco Use   Smoking status: Never   Smokeless tobacco: Never  Vaping Use   Vaping status: Never Used  Substance Use Topics   Alcohol use: Never   Drug use: Never   No Known Allergies No medications prior to admission.   I have reviewed patient's Past Medical Hx, Surgical Hx, Family Hx, Social Hx, medications and allergies.   ROS  Pertinent items noted in HPI and remainder of comprehensive ROS otherwise negative.   PHYSICAL EXAM  Patient Vitals for the past 24 hrs:  BP Temp Temp src Pulse Resp SpO2 Height  01/22/24 1109 (!) 111/57 98.1 F (36.7 C) Oral 67 18 100 % 4' 11 (1.499 m)  01/22/24 1054 123/80 98.5 F (36.9 C) Oral 91 18 100 % --    Constitutional: Well-developed, well-nourished female in no acute distress.  Cardiovascular: normal rate & rhythm, warm and well-perfused Respiratory: normal effort, no problems with respiration noted GI: Abd soft,  non-tender, non-distended MS: Extremities nontender, no edema, normal ROM Neurologic: Alert and oriented x 4. No weakness or neuro deficits noted Pelvic: exam deferred   Labs: No results found for this or any previous visit (from the past 24 hours).  Imaging:  No results found.  MDM & MAU COURSE  MDM: Moderate  MAU Course: Orders Placed This Encounter  Procedures   CBC with Differential/Platelet   Discharge patient Discharge disposition: 01-Home or Self Care; Discharge patient date: 01/22/2024   Meds ordered this encounter  Medications   lactated ringers  bolus 1,000 mL   acetaminophen -caffeine  (EXCEDRIN TENSION HEADACHE) 500-65 MG per tablet 2 tablet   cyclobenzaprine  (FLEXERIL ) tablet 10 mg   dexamethasone  (DECADRON ) injection 10 mg    diphenhydrAMINE  (BENADRYL ) injection 12.5 mg   cyclobenzaprine  (FLEXERIL ) 5 MG tablet    Sig: Take 1 tablet (5 mg total) by mouth 3 (three) times daily as needed for up to 7 days for muscle spasms.    Dispense:  21 tablet    Refill:  0   acetaminophen -caffeine  (EXCEDRIN TENSION HEADACHE) 500-65 MG TABS per tablet    Sig: Take 1 tablet by mouth every 8 (eight) hours as needed.    Dispense:  60 tablet    Refill:  0   HA cocktail ordered, headache relieved and pt stable for discharge.  ASSESSMENT   1. Status post C-section   2. Spinal headache    PLAN  Discharge home in stable condition with return precautions.     Allergies as of 01/22/2024   No Known Allergies      Medication List     STOP taking these medications    ibuprofen  600 MG tablet Commonly known as: ADVIL    oxyCODONE  5 MG immediate release tablet Commonly known as: Roxicodone        TAKE these medications    acetaminophen -caffeine  500-65 MG Tabs per tablet Commonly known as: EXCEDRIN TENSION HEADACHE Take 1 tablet by mouth every 8 (eight) hours as needed.   cyclobenzaprine  5 MG tablet Commonly known as: FLEXERIL  Take 1 tablet (5 mg total) by mouth 3 (three) times daily as needed for up to 7 days for muscle spasms.       Cornell Finder, CNM, MSN, IBCLC Certified Nurse Midwife, [facility]

## 2024-01-22 NOTE — MAU Note (Signed)
 Kathy Harris is a 23 y.o. here in MAU reporting: Rc/s 8/4.  Had blood patch Wed night. Dc'd on Thursday- was not having HA at that time. This morning HA came back, is throbbing in her neck, going to her head. Worse when is sitting or standing, goes away when she lays down.  Took Ibuprofen  at 0500, didn't help at all.  Denies BP problems during preg.  Baby is good, is breast and bottle feeding. Going well, no breast issues. Bleeding is WNL.  No fever, no odor.  No issues with urination, has had BM. Onset of complaint: 0300 Pain score: 10 Vitals:   01/22/24 1054  BP: 123/80  Pulse: 91  Resp: 18  Temp: 98.5 F (36.9 C)  SpO2: 100%      Lab orders placed from triage:

## 2024-01-24 ENCOUNTER — Inpatient Hospital Stay (HOSPITAL_COMMUNITY)

## 2024-01-24 ENCOUNTER — Other Ambulatory Visit: Payer: Self-pay

## 2024-01-24 ENCOUNTER — Encounter (HOSPITAL_COMMUNITY): Payer: Self-pay | Admitting: Family Medicine

## 2024-01-24 ENCOUNTER — Inpatient Hospital Stay (HOSPITAL_COMMUNITY)
Admission: AD | Admit: 2024-01-24 | Discharge: 2024-01-26 | DRG: 776 | Disposition: A | Discharge status: Home / Self Care | Payer: Self-pay | Attending: Internal Medicine | Admitting: Internal Medicine

## 2024-01-24 DIAGNOSIS — O9081 Anemia of the puerperium: Secondary | ICD-10-CM | POA: Diagnosis present

## 2024-01-24 DIAGNOSIS — O99214 Obesity complicating childbirth: Secondary | ICD-10-CM | POA: Diagnosis present

## 2024-01-24 DIAGNOSIS — G589 Mononeuropathy, unspecified: Secondary | ICD-10-CM

## 2024-01-24 DIAGNOSIS — G009 Bacterial meningitis, unspecified: Secondary | ICD-10-CM | POA: Diagnosis not present

## 2024-01-24 DIAGNOSIS — K5900 Constipation, unspecified: Secondary | ICD-10-CM | POA: Diagnosis present

## 2024-01-24 DIAGNOSIS — O99215 Obesity complicating the puerperium: Secondary | ICD-10-CM | POA: Diagnosis present

## 2024-01-24 DIAGNOSIS — B004 Herpesviral encephalitis: Secondary | ICD-10-CM | POA: Diagnosis present

## 2024-01-24 DIAGNOSIS — O9853 Other viral diseases complicating the puerperium: Principal | ICD-10-CM | POA: Diagnosis present

## 2024-01-24 DIAGNOSIS — E876 Hypokalemia: Secondary | ICD-10-CM | POA: Diagnosis present

## 2024-01-24 DIAGNOSIS — Z79899 Other long term (current) drug therapy: Secondary | ICD-10-CM

## 2024-01-24 DIAGNOSIS — O9963 Diseases of the digestive system complicating the puerperium: Secondary | ICD-10-CM | POA: Diagnosis present

## 2024-01-24 DIAGNOSIS — O99285 Endocrine, nutritional and metabolic diseases complicating the puerperium: Secondary | ICD-10-CM | POA: Diagnosis present

## 2024-01-24 DIAGNOSIS — G4452 New daily persistent headache (NDPH): Secondary | ICD-10-CM | POA: Diagnosis not present

## 2024-01-24 DIAGNOSIS — B1009 Other human herpesvirus encephalitis: Secondary | ICD-10-CM | POA: Diagnosis not present

## 2024-01-24 DIAGNOSIS — A1801 Tuberculosis of spine: Secondary | ICD-10-CM

## 2024-01-24 DIAGNOSIS — D649 Anemia, unspecified: Secondary | ICD-10-CM | POA: Diagnosis present

## 2024-01-24 DIAGNOSIS — Z98891 History of uterine scar from previous surgery: Secondary | ICD-10-CM

## 2024-01-24 DIAGNOSIS — E66811 Obesity, class 1: Secondary | ICD-10-CM | POA: Diagnosis present

## 2024-01-24 DIAGNOSIS — Z8249 Family history of ischemic heart disease and other diseases of the circulatory system: Secondary | ICD-10-CM

## 2024-01-24 DIAGNOSIS — Z833 Family history of diabetes mellitus: Secondary | ICD-10-CM

## 2024-01-24 DIAGNOSIS — G038 Meningitis due to other specified causes: Principal | ICD-10-CM

## 2024-01-24 LAB — CBC
HCT: 29.1 % — ABNORMAL LOW (ref 36.0–46.0)
Hemoglobin: 9.2 g/dL — ABNORMAL LOW (ref 12.0–15.0)
MCH: 27 pg (ref 26.0–34.0)
MCHC: 31.6 g/dL (ref 30.0–36.0)
MCV: 85.3 fL (ref 80.0–100.0)
Platelets: 310 K/uL (ref 150–400)
RBC: 3.41 MIL/uL — ABNORMAL LOW (ref 3.87–5.11)
RDW: 15 % (ref 11.5–15.5)
WBC: 12.6 K/uL — ABNORMAL HIGH (ref 4.0–10.5)
nRBC: 0 % (ref 0.0–0.2)

## 2024-01-24 LAB — URINALYSIS, ROUTINE W REFLEX MICROSCOPIC
Bilirubin Urine: NEGATIVE
Glucose, UA: NEGATIVE mg/dL
Ketones, ur: NEGATIVE mg/dL
Nitrite: NEGATIVE
Protein, ur: NEGATIVE mg/dL
Specific Gravity, Urine: 1.014 (ref 1.005–1.030)
pH: 7 (ref 5.0–8.0)

## 2024-01-24 LAB — COMPREHENSIVE METABOLIC PANEL WITH GFR
ALT: 24 U/L (ref 0–44)
AST: 23 U/L (ref 15–41)
Albumin: 2 g/dL — ABNORMAL LOW (ref 3.5–5.0)
Alkaline Phosphatase: 87 U/L (ref 38–126)
Anion gap: 13 (ref 5–15)
BUN: 6 mg/dL (ref 6–20)
CO2: 18 mmol/L — ABNORMAL LOW (ref 22–32)
Calcium: 8.3 mg/dL — ABNORMAL LOW (ref 8.9–10.3)
Chloride: 107 mmol/L (ref 98–111)
Creatinine, Ser: 0.77 mg/dL (ref 0.44–1.00)
GFR, Estimated: >60 mL/min (ref >60)
Glucose, Bld: 91 mg/dL (ref 70–99)
Potassium: 3.9 mmol/L (ref 3.5–5.1)
Sodium: 138 mmol/L (ref 135–145)
Total Bilirubin: 0.6 mg/dL (ref 0.0–1.2)
Total Protein: 5.5 g/dL — ABNORMAL LOW (ref 6.5–8.1)

## 2024-01-24 LAB — RESPIRATORY PANEL BY PCR

## 2024-01-24 LAB — CSF CELL COUNT WITH DIFFERENTIAL
Eosinophils, CSF: 0 % (ref 0–1)
Eosinophils, CSF: 0 % (ref 0–1)
Lymphs, CSF: 57 % (ref 40–80)
Lymphs, CSF: 62 % (ref 40–80)
Monocyte-Macrophage-Spinal Fluid: 12 % — ABNORMAL LOW (ref 15–45)
Monocyte-Macrophage-Spinal Fluid: 13 % — ABNORMAL LOW (ref 15–45)
RBC Count, CSF: 2000 /mm3 — ABNORMAL HIGH
RBC Count, CSF: 5000 /mm3 — ABNORMAL HIGH
Segmented Neutrophils-CSF: 26 % — ABNORMAL HIGH (ref 0–6)
Segmented Neutrophils-CSF: 30 % — ABNORMAL HIGH (ref 0–6)
Tube #: 1
Tube #: 4
WBC, CSF: 223 /mm3 (ref 0–5)
WBC, CSF: 237 /mm3 (ref 0–5)

## 2024-01-24 LAB — MENINGITIS/ENCEPHALITIS PANEL (CSF)
Cryptococcus neoformans/gattii (CSF): NOT DETECTED
Cytomegalovirus (CSF): NOT DETECTED
Enterovirus (CSF): NOT DETECTED
Escherichia coli K1 (CSF): NOT DETECTED
Haemophilus influenzae (CSF): NOT DETECTED
Herpes simplex virus 1 (CSF): NOT DETECTED
Herpes simplex virus 2 (CSF): DETECTED — AB
Human herpesvirus 6 (CSF): NOT DETECTED
Human parechovirus (CSF): NOT DETECTED
Listeria monocytogenes (CSF): NOT DETECTED
Neisseria meningitis (CSF): NOT DETECTED
Streptococcus agalactiae (CSF): NOT DETECTED
Streptococcus pneumoniae (CSF): NOT DETECTED
Varicella zoster virus (CSF): NOT DETECTED

## 2024-01-24 LAB — C-REACTIVE PROTEIN: CRP: 5.5 mg/dL — ABNORMAL HIGH (ref <1.0)

## 2024-01-24 LAB — TYPE AND SCREEN
ABO/RH(D): AB POS
Antibody Screen: NEGATIVE

## 2024-01-24 LAB — PROTIME-INR
INR: 1 (ref 0.8–1.2)
Prothrombin Time: 13.5 s (ref 11.4–15.2)

## 2024-01-24 LAB — PROTEIN AND GLUCOSE, CSF
Glucose, CSF: 32 mg/dL — ABNORMAL LOW (ref 40–70)
Total  Protein, CSF: 56 mg/dL — ABNORMAL HIGH (ref 15–45)

## 2024-01-24 MED ORDER — LIDOCAINE HCL (PF) 1 % IJ SOLN
5.0000 mL | Freq: Once | INTRAMUSCULAR | Status: AC
  Administered 2024-01-24: 5 mL via INTRADERMAL

## 2024-01-24 MED ORDER — LACTATED RINGERS IV BOLUS
1000.0000 mL | Freq: Once | INTRAVENOUS | Status: AC
  Administered 2024-01-24: 1000 mL via INTRAVENOUS

## 2024-01-24 MED ORDER — PROCHLORPERAZINE EDISYLATE 10 MG/2ML IJ SOLN
10.0000 mg | Freq: Once | INTRAMUSCULAR | Status: AC
  Administered 2024-01-24: 10 mg via INTRAVENOUS
  Filled 2024-01-24: qty 2

## 2024-01-24 MED ORDER — VANCOMYCIN HCL IN DEXTROSE 1-5 GM/200ML-% IV SOLN
1000.0000 mg | Freq: Three times a day (TID) | INTRAVENOUS | Status: DC
  Filled 2024-01-24: qty 200

## 2024-01-24 MED ORDER — VANCOMYCIN HCL 1500 MG/300ML IV SOLN
1500.0000 mg | Freq: Two times a day (BID) | INTRAVENOUS | Status: DC
  Filled 2024-01-24: qty 300

## 2024-01-24 MED ORDER — DEXTROSE 5 % IV SOLN
10.0000 mg/kg | Freq: Three times a day (TID) | INTRAVENOUS | Status: DC
  Administered 2024-01-24 – 2024-01-26 (×7): 610 mg via INTRAVENOUS
  Filled 2024-01-24 (×8): qty 12.2

## 2024-01-24 MED ORDER — SODIUM CHLORIDE 0.9 % IV SOLN: INTRAVENOUS | Status: DC

## 2024-01-24 MED ORDER — DEXTROSE 5 % IV SOLN
10.0000 mg/kg | Freq: Three times a day (TID) | INTRAVENOUS | Status: DC
  Filled 2024-01-24 (×2): qty 17.2

## 2024-01-24 MED ORDER — GADOBUTROL 1 MMOL/ML IV SOLN
8.5000 mL | Freq: Once | INTRAVENOUS | Status: AC | PRN
  Administered 2024-01-24: 8.5 mL via INTRAVENOUS

## 2024-01-24 MED ORDER — VANCOMYCIN HCL 1750 MG/350ML IV SOLN
1750.0000 mg | Freq: Once | INTRAVENOUS | Status: AC
  Administered 2024-01-24: 1750 mg via INTRAVENOUS
  Filled 2024-01-24: qty 350

## 2024-01-24 MED ORDER — VANCOMYCIN HCL IN DEXTROSE 1-5 GM/200ML-% IV SOLN: 1000.0000 mg | Freq: Three times a day (TID) | INTRAVENOUS | Status: DC

## 2024-01-24 MED ORDER — SODIUM CHLORIDE 0.9 % IV SOLN
2.0000 g | Freq: Three times a day (TID) | INTRAVENOUS | Status: DC
  Administered 2024-01-24 (×2): 2 g via INTRAVENOUS
  Filled 2024-01-24 (×3): qty 12.5

## 2024-01-24 MED ORDER — DIPHENHYDRAMINE HCL 50 MG/ML IJ SOLN
25.0000 mg | Freq: Once | INTRAMUSCULAR | Status: AC
  Administered 2024-01-24: 25 mg via INTRAVENOUS
  Filled 2024-01-24: qty 1

## 2024-01-24 MED ORDER — LIDOCAINE HCL (PF) 1 % IJ SOLN
5.0000 mL | Freq: Once | INTRAMUSCULAR | Status: AC
  Administered 2024-01-24: 3 mL via INTRADERMAL

## 2024-01-24 MED ORDER — LIDOCAINE 1 % OPTIME INJ - NO CHARGE
5.0000 mL | Freq: Once | INTRAMUSCULAR | Status: AC
  Administered 2024-01-24: 5 mL via INTRADERMAL

## 2024-01-24 MED ORDER — ACETAMINOPHEN 325 MG PO TABS
650.0000 mg | ORAL_TABLET | Freq: Four times a day (QID) | ORAL | Status: DC | PRN
  Administered 2024-01-24 – 2024-01-25 (×3): 650 mg via ORAL
  Filled 2024-01-24 (×2): qty 2

## 2024-01-24 NOTE — Progress Notes (Signed)
 Spoke with Dr. Liguori (Neonatologist) regarding HSV2 encephalitis finding and concern for possible exposure for the baby. I was able to put Kathy Harris on speaker phone with Dr. Maryland to discuss appropriate testing for [name]. Per patient report, baby is acting normal and well. Dr. Maryland will call his pediatrician's office in the morning to make recommendation for outpatient HSV testing. If changes to baby's behavior or low temperature, he could be tested in the emergency room. Dr. Maryland also recommends HSV 2 IgG/IgM testing for Kathy Harris as this would help stratify risk for [name].   Patient denies any history of oral or genital herpes, no known lesions or exposures.   EMERSON Chapel MD 01/24/24 10:18 PM

## 2024-01-24 NOTE — Progress Notes (Addendum)
 See MAU note by Dr. Nicholaus for hospital course so far today.  POD6 s/p c-section with spinal, treated for PDSH on 8/6 with blood patch, now with headache, fever, neck stiffness, exam/labs concerning for meningitis.  Cesarean incision clean/dry/intact without tenderness, no uterine tenderness.  No concerns for c-section complications, UTI, endometritis, or mastitis at this time.   IR planning lumbar puncture today.  I spoke with Dr. Voncile with Neurology who recommends hospitalist admission, Neurology available for consult/recommendations. Consult order placed.  I spoke with Dr. Georgina with Hospitalist team who agrees to admission.    EMERSON Chapel MD 01/24/24 8:31 AM

## 2024-01-24 NOTE — Progress Notes (Addendum)
 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  Updated recommendations For now continue acyclovir .  Also continue antibiotics Please involve ID for further recommendations Further treatment will be guided by infectious disease. Lumbar MRI official read pending-preliminary review with radiology-no evidence of abscess.  Mild enhancement in the L 4 5 and S1 region likely postprocedural or related to the recent blood patch.  No compression of the spinal canal.  Plan relayed to admitting Dr. Georgina.  Inpatient neurology will be available as needed    -- Eligio Lav, MD Neurologist [facility]

## 2024-01-24 NOTE — Procedures (Signed)
 PROCEDURE SUMMARY:  Successful fluoroscopic guided lumbar puncture. No immediate complications.  Pt tolerated well.   EBL = none  Please see full dictation in imaging section of Epic for procedure details.

## 2024-01-24 NOTE — MAU Provider Note (Signed)
 MAU Provider Note  Chief Complaint: Headache and Back Pain   Event Date/Time   First Provider Initiated Contact with Patient 01/24/24 0423      SUBJECTIVE HPI: Kathy Harris is a 23 y.o. [id number] at POD#6 rLTCS who presents to maternity admissions reporting headache and lumbar pain. Pregnancy c/b polyhydramnios. Receives [facility] with [facility].  Patient presents via EMS for worsening headache and lumbar pain. Patient underwent uncomplicated repeat C/S 8/4. However did develop a spinal headache and was treated with a blood patch on 8/5. She notes this was helpful at first. Headache has since worsened and developed more lumbar pain. Tonight she developed chills and excruciating lumbar pain which radiates up and down her back. She has not checked her temperature at home. Feels weak but is able to walk. She denies N/V, urinary symptoms, heavy lochia, abdominal pain, constipation.  HPI  Past Medical History:  Diagnosis Date   Anemia    Phreesia 05/31/2020   Cesarean delivery delivered 06/05/2020   Encounter for routine postpartum follow-up 07/13/2020   Non-reassuring fetal heart rate or rhythm affecting management of mother 06/05/2020   Supervision of high risk pregnancy in second trimester 02/01/2020   Term pregnancy 01/22/2022   Past Surgical History:  Procedure Laterality Date   CESAREAN SECTION     CESAREAN SECTION N/A 01/22/2022   Procedure: CESAREAN SECTION;  Surgeon: Okey Leader, MD;  Location: [facility];  Service: Obstetrics;  Laterality: N/A;   CESAREAN SECTION N/A 01/18/2024   Procedure: CESAREAN DELIVERY;  Surgeon: Okey Leader, MD;  Location: [facility] LD ORS;  Service: Obstetrics;  Laterality: N/A;   CESAREAN SECTION MULTI-GESTATIONAL N/A 06/05/2020   Procedure: CESAREAN SECTION MULTI-GESTATIONAL;  Surgeon: Barbra Lang PARAS, DO;  Location: [facility] LD ORS;  Service: Obstetrics;  Laterality: N/A;   TONSILLECTOMY     WISDOM TOOTH EXTRACTION     Social History   Socioeconomic  History   Marital status: Married    Spouse name: Not on file   Number of children: Not on file   Years of education: Not on file   Highest education level: Not on file  Occupational History   Not on file  Tobacco Use   Smoking status: Never   Smokeless tobacco: Never  Vaping Use   Vaping status: Never Used  Substance and Sexual Activity   Alcohol use: Never   Drug use: Never   Sexual activity: Not Currently    Partners: Male    Birth control/protection: None  Other Topics Concern   Not on file  Social History Narrative   Not on file   Social Drivers of Health   Financial Resource Strain: Not on file  Food Insecurity: No Food Insecurity (01/22/2024)   Hunger Vital Sign    Worried About Running Out of Food in the Last Year: Never true    Ran Out of Food in the Last Year: Never true  Transportation Needs: No Transportation Needs (01/22/2024)   PRAPARE - Administrator, Civil Service (Medical): No    Lack of Transportation (Non-Medical): No  Physical Activity: Not on file  Stress: Not on file  Social Connections: Not on file  Intimate Partner Violence: Not At Risk (01/22/2024)   Humiliation, Afraid, Rape, and Kick questionnaire    Fear of Current or Ex-Partner: No    Emotionally Abused: No    Physically Abused: No    Sexually Abused: No   Current Facility-Administered Medications on File Prior to Encounter  Medication  Dose Route Frequency Provider Last Rate Last Admin   lactated ringers  infusion   Intravenous Continuous PRN Cosgrove, Ali B, CRNA   Stopped at 01/20/24 2301   Current Outpatient Medications on File Prior to Encounter  Medication Sig Dispense Refill   acetaminophen -caffeine  (EXCEDRIN TENSION HEADACHE) 500-65 MG TABS per tablet Take 1 tablet by mouth every 8 (eight) hours as needed. 60 tablet 0   cyclobenzaprine  (FLEXERIL ) 5 MG tablet Take 1 tablet (5 mg total) by mouth 3 (three) times daily as needed for up to 7 days for muscle spasms. 21 tablet 0    No Known Allergies  ROS:  Pertinent positives/negatives listed above.  I have reviewed patient's Past Medical Hx, Surgical Hx, Family Hx, Social Hx, medications and allergies.   Physical Exam  Patient Vitals for the past 24 hrs:  BP Temp Temp src Pulse Resp SpO2 Height Weight  01/24/24 0611 129/74 98.7 F (37.1 C) Oral 70 16 -- -- --  01/24/24 0427 -- -- -- -- -- -- 4' 11 (1.499 m) 86.2 kg  01/24/24 0416 131/68 (!) 100.7 F (38.2 C) Oral 78 20 99 % -- --   Constitutional: Well-developed, well-nourished female in acute distress Cardiovascular: normal rate Respiratory: normal effort GI: Abd soft, non-tender. Well healing C/S incision without drainage, erythema, tenderness. No uterine tenderness MS: Extremities nontender, no edema, normal ROM Neurologic: Alert and oriented x 4. Logical/linear thought process. No focal deficits. Sensation intact distal extremities. Strength intact lower extremities. Has pain with flexion/extension of neck Back: well-healed spinal insertion site. Tenderness to palpation surrounding. No erythema, drainage  LAB RESULTS Results for orders placed or performed during the hospital encounter of 01/24/24 (from the past 24 hours)  Type and screen [facility]     Status: None   Collection Time: 01/24/24  4:59 AM  Result Value Ref Range   ABO/RH(D) AB POS    Antibody Screen NEG    Sample Expiration      01/27/2024,2359 Performed at [facility], [address] [place], [place] [address]   Comprehensive metabolic panel     Status: Abnormal   Collection Time: 01/24/24  5:05 AM  Result Value Ref Range   Sodium 138 135 - 145 mmol/L   Potassium 3.9 3.5 - 5.1 mmol/L   Chloride 107 98 - 111 mmol/L   CO2 18 (L) 22 - 32 mmol/L   Glucose, Bld 91 70 - 99 mg/dL   BUN 6 6 - 20 mg/dL   Creatinine, Ser 9.22 0.44 - 1.00 mg/dL   Calcium 8.3 (L) 8.9 - 10.3 mg/dL   Total Protein 5.5 (L) 6.5 - 8.1 g/dL   Albumin 2.0 (L) 3.5 - 5.0 g/dL   AST  23 15 - 41 U/L   ALT 24 0 - 44 U/L   Alkaline Phosphatase 87 38 - 126 U/L   Total Bilirubin 0.6 0.0 - 1.2 mg/dL   GFR, Estimated >39 >39 mL/min   Anion gap 13 5 - 15  C-reactive protein     Status: Abnormal   Collection Time: 01/24/24  5:05 AM  Result Value Ref Range   CRP 5.5 (H) <1.0 mg/dL  CBC     Status: Abnormal   Collection Time: 01/24/24  6:23 AM  Result Value Ref Range   WBC 12.6 (H) 4.0 - 10.5 K/uL   RBC 3.41 (L) 3.87 - 5.11 MIL/uL   Hemoglobin 9.2 (L) 12.0 - 15.0 g/dL   HCT 70.8 (L) 63.9 - 53.9 %  MCV 85.3 80.0 - 100.0 fL   MCH 27.0 26.0 - 34.0 pg   MCHC 31.6 30.0 - 36.0 g/dL   RDW 84.9 88.4 - 84.4 %   Platelets 310 150 - 400 K/uL   nRBC 0.0 0.0 - 0.2 %  Protime-INR     Status: None   Collection Time: 01/24/24  6:23 AM  Result Value Ref Range   Prothrombin Time 13.5 11.4 - 15.2 seconds   INR 1.0 0.8 - 1.2    --/--/AB POS (08/10 0459)  IMAGING CT LUMBAR SPINE WO CONTRAST Result Date: 01/24/2024 EXAM: CT OF THE LUMBAR SPINE WITHOUT CONTRAST 01/24/2024 05:16:19 AM TECHNIQUE: CT of the lumbar spine was performed without the administration of intravenous contrast. Multiplanar reformatted images are provided for review. Automated exposure control, iterative reconstruction, and/or weight based adjustment of the mA/kV was utilized to reduce the radiation dose to as low as reasonably achievable. COMPARISON: None available. CLINICAL HISTORY: S/p spinal for C/S. Concern for abscess/meningitis. Needs CT prior to LP. FINDINGS: BONES AND ALIGNMENT: Normal vertebral body heights. No acute fracture or suspicious bone lesion. Normal alignment. DEGENERATIVE CHANGES: Mild disc bulging is present at L3-4 and L4-5. SOFT TISSUES: Gas is present in the dorsal epidural space consistent with recent instrumentation. No definite fluid collection is present within the lumbar spinal canal. Blood products are present within an enlarged uterus consistent with recent c-section. IMPRESSION: 1. No  definite fluid collection within the lumbar spinal canal. 2. Gas in the dorsal epidural space consistent with recent instrumentation. Electronically signed by: Lonni Necessary MD 01/24/2024 05:42 AM EDT RP Workstation: [id number]   CT HEAD WO CONTRAST ( ) Result Date: 01/24/2024 EXAM: CT HEAD WITHOUT CONTRAST 01/24/2024 05:15:59 AM TECHNIQUE: CT of the head was performed without the administration of intravenous contrast. Automated exposure control, iterative reconstruction, and/or weight based adjustment of the mA/kV was utilized to reduce the radiation dose to as low as reasonably achievable. COMPARISON: None available. CLINICAL HISTORY: Headache and fever. Status post spinal anesthesia for cesarean section. Concern for meningitis. FINDINGS: BRAIN AND VENTRICLES: No acute hemorrhage. Gray-white differentiation is preserved. No hydrocephalus. No extra-axial collection. No mass effect or midline shift. ORBITS: No acute abnormality. SINUSES: No acute abnormality. SOFT TISSUES AND SKULL: No acute soft tissue abnormality. No skull fracture. IMPRESSION: 1. No acute intracranial abnormality. Electronically signed by: Lonni Necessary MD 01/24/2024 05:39 AM EDT RP Workstation: [id number]    MAU Management/MDM: Orders Placed This Encounter  Procedures   Culture, blood (Routine X 2) w Reflex to ID Panel   CT HEAD WO CONTRAST ( )   CT LUMBAR SPINE WO CONTRAST   IR LUMBAR PUNCTURE   Comprehensive metabolic panel   C-reactive protein   CBC   Protime-INR   Urinalysis, Routine w reflex microscopic -Urine, Clean Catch   Diet NPO time specified   vancomycin  per pharmacy consult   Type and screen [facility]   Insert peripheral IV    Meds ordered this encounter  Medications   lactated ringers  bolus 1,000 mL   ceFEPIme  (MAXIPIME ) 2 g in sodium chloride  0.9 % 100 mL IVPB    Antibiotic Indication::   Other Indication (list below)    Other Indication::   Meningitis   DISCONTD:  vancomycin  (VANCOCIN ) IVPB 1000 mg/200 mL premix    Indication::   Other Indication (list below)    Other Indication::   meningitis   FOLLOWED BY Linked Order Group    vancomycin  (VANCOREADY) IVPB 1750 mg/350 mL  Indication::   Other Indication (list below)     Other Indication::   meningitis    vancomycin  (VANCOREADY) IVPB 1500 mg/300 mL     Indication::   Other Indication (list below)     Other Indication::   meningitis     Available prenatal and delivery records reviewed.  Patient presents with worsening headache and lumbar pain POD#6 from rLTCS and subsequent blood patch for spinal headache. Do have concern for meningitis vs spinal abscess given fever, recent spinal and blood patch, and meningeal signs. Does not meet septic criteria this time. Her exam in not consistent with an endometritis, intra-abdominal process, or post-op bleed. Will obtain UA to rule-out UTI. Anesthesia and neurology called immediately after seeing patient for further recommendations.   - Dr. Darlyn to bedside to assess patient. Recommended calling neurology - Neurologist, Dr. Michaela, also consulted who recommends treating empirically for meningitis while planning for LP. Additionally ordered CT head and lumbar spine to assess for any contraindications to LP at this time. - Consulted with pharmacist to determine appropriate antibiotics. Will start vanc and cefepime   - CT: no acute intracranial abnormality. Lumbar spine without definite abscess. Does show gas in dorsal epidural space consistent with recent instrumentation - Dr. Michaela called back after reviewing imaging. Recommends LP - OB attending, Dr. Diedre, called and updated on findings and current treatment plan - Initial CBC tube clotted. Had to redraw - Consulted IR, Dr. Philip, to do lumbar puncture today. Order placed - Dr. Diedre updated and will assume care of patient [phone])  ASSESSMENT 1. Meningitis after procedure   2. Status  post repeat low transverse cesarean section     PLAN Admit for LP and IV antibiotics for meningitis after spinal.  Almarie Moats, MD OB Fellow 01/24/2024  7:14 AM

## 2024-01-24 NOTE — Progress Notes (Addendum)
 The patient is  running temp of 100l.8 and no order PRN Tylenol . Notified Dr. Franky and received a new order for it. Will administer and continue to monitor.

## 2024-01-24 NOTE — Progress Notes (Signed)
 Anesthesia Note: Called to MAU for pt post EBP on 8/4 for PDPH. She presented to MAU with worsening headache, neck pain, LE muscle spasms, and fever. She has some tenderness over lumbar spine presumably at needle insertion site, but no swelling or redness. Skin is well healed.   Discussed with MAU provider. Recommend neurology consult for recommendations for potential meningitis workup.

## 2024-01-24 NOTE — Progress Notes (Signed)
 Pharmacy Antibiotic Note  Kathy Harris is a 23 y.o. female admitted on 01/24/2024 with r/o meningitis.  Pt recently had cesarean section on 8/4. Subsequently, had a spinal headache with blood patch placed by anesthesia.  She returns to MAU with  headache, neck pain, LE muscle spasms, and fever.  Meningitis workup underway. Pharmacy has been consulted for vancomycin  dosing.  Plan:. Vancomycin  loading dose 1750 mg IV x 1 followed by  Vancomycin  1000mg  IV every 8 hours.  Goal trough 15-20 mcg/mL.  Height: 4' 11 (149.9 cm) Weight: 86.2 kg (190 lb) IBW/kg (Calculated) : 43.2  Temp (24hrs), Avg:99.7 F (37.6 C), Min:98.4 F (36.9 C), Max:100.8 F (38.2 C)  Recent Labs  Lab 01/19/24 0746 01/20/24 1030 01/22/24 1203 01/24/24 0505 01/24/24 0623  WBC 13.1* 11.1* 9.5  --  12.6*  CREATININE 0.75 0.76  --  0.77  --     Estimated Creatinine Clearance: 104.3 mL/min (by C-G formula based on SCr of 0.77 mg/dL).    No Known Allergies  Antimicrobials this admission: Cefepime  2g Q8 8/10 >>  vancomycin  8/10 >>    Microbiology results: 8/10 BCx: p  Thank you for allowing pharmacy to be a part of this patient's care.  Kathy Harris Alert 01/24/2024 2:36 PM

## 2024-01-24 NOTE — Consult Note (Signed)
 NEUROLOGY CONSULT NOTE   Date of service: January 24, 2024 Patient Name: Kathy Harris MRN:  [id number] DOB:  July 24, 2000 Chief Complaint: Headache Requesting Provider: Diedre Rosaline BRAVO, MD  History of Present Illness  Kathy Harris is a 23 y.o. female with hx of recent c section with epidural who is presenting with continue headache and new onset fever and neck stiffness.  She underwent a c-section on 8/4 and and a blood patch on 8/6. She returned to MAU due to headache, fever, neck stiffness. Plan for lumbar puncture with IR.  Epidural complicated with difficulty with her SAB and had to use a Touhy needle and Spinocan needle. She reports that her headache improved Wednesday night into Thursday morning and she was discharged. Thursday night her headache worsened. She is now having a pretty consistent headache regardless of positioning. Rates the pain as an 8/10. She also has pain and weakness in her right leg. She endorses a sharp shooting electrical pain. She has significant pain with palpation over her epidural and blood patch site.   Temp 100.8 in MAU She has started to receive antibiotics and she did also receive 1L of LR    ROS  Comprehensive ROS performed and pertinent positives documented in HPI   Past History   Past Medical History:  Diagnosis Date   Anemia    Phreesia 05/31/2020   Cesarean delivery delivered 06/05/2020   Encounter for routine postpartum follow-up 07/13/2020   Non-reassuring fetal heart rate or rhythm affecting management of mother 06/05/2020   Supervision of high risk pregnancy in second trimester 02/01/2020   Term pregnancy 01/22/2022    Past Surgical History:  Procedure Laterality Date   CESAREAN SECTION     CESAREAN SECTION N/A 01/22/2022   Procedure: CESAREAN SECTION;  Surgeon: Okey Leader, MD;  Location: [facility] LD [facility]S;  Service: Obstetrics;  Laterality: N/A;   CESAREAN SECTION N/A 01/18/2024   Procedure: CESAREAN DELIVERY;  Surgeon: Okey Leader, MD;  Location: [facility]S;  Service: Obstetrics;  Laterality: N/A;   CESAREAN SECTION MULTI-GESTATIONAL N/A 06/05/2020   Procedure: CESAREAN SECTION MULTI-GESTATIONAL;  Surgeon: Barbra Lang PARAS, DO;  Location: [facility] LD [facility]S;  Service: Obstetrics;  Laterality: N/A;   TONSILLECTOMY     WISDOM TOOTH EXTRACTION      Family History: Family History  Problem Relation Age of Onset   Heart murmur Mother    Diabetes Father    Hypertension Father     Social History  reports that she has never smoked. She has never used smokeless tobacco. She reports that she does not drink alcohol and does not use drugs.  No Known Allergies  Medications   Current Facility-Administered Medications:    ceFEPIme  (MAXIPIME ) 2 g in sodium chloride  0.9 % 100 mL IVPB, 2 g, Intravenous, Q8H, Nicholaus Almarie HERO, MD, Last Rate: 200 mL/hr at 01/24/24 0522, 2 g at 01/24/24 0522   [COMPLETED] vancomycin  (VANCOREADY) IVPB 1750 mg/350 mL, 1,750 mg, Intravenous, Once, Last Rate: 175 mL/hr at 01/24/24 0610, 1,750 mg at 01/24/24 0610 **FOLLOWED BY** vancomycin  (VANCOREADY) IVPB 1500 mg/300 mL, 1,500 mg, Intravenous, Q12H, Pratt, Tanya S, MD  Facility-Administered Medications Ordered in Other Encounters:    lactated ringers  infusion, , Intravenous, Continuous PRN, [place] Hildegard NOVAK, CRNA, Stopped at 01/20/24 2301  Vitals   Vitals:   01/24/24 0440 01/24/24 0445 01/24/24 0611 01/24/24 0750  BP:   129/74 134/82  Pulse:   70 67  Resp:   16 20  Temp:  98.7 F (37.1 C) (!) 100.8 F (38.2 C)  TempSrc:   Oral Oral  SpO2: 98% 98%    Weight:      Height:        Body mass index is 38.38 kg/m.   Physical Exam  Constitutional: Appears well-developed and well-nourished. Appears in pain Psych: Affect appropriate to situation.  Eyes: No scleral injection.  HENT: No OP obstruction.  Head: Normocephalic.  Cardiovascular: Normal rate and regular rhythm.  Respiratory: Effort normal, non-labored breathing.  GI: Soft.  No  distension. There is no tenderness.  Skin: WDI.   Neurologic Examination  Mental Status: Patient awakens easily, oriented to person, place, month, year, and situation. Patient is able to give a clear and coherent history. No signs of aphasia or neglect Cranial Nerves: II: Visual Fields are full. Pupils are equal, round, and reactive to light.   III,IV, VI: EOMI without ptosis or diploplia.  V: Facial sensation is symmetric to temperature VII: Facial movement is symmetric resting and smiling VIII: Hearing is intact to voice X: Palate elevates symmetrically XI: Shoulder shrug is symmetric. XII: Tongue protrudes midline without atrophy or fasciculations.  Motor: Tone is normal. Bulk is normal. Bilateral upper extremities full strength.  Dorsiflexion weaker on the right with difficulty with elevation due to pain in right leg.  Left lower extremity with full  Sensory: Sensation is symmetric to light touch and temperature in the arms and legs. No extinction to DSS present.  Cerebellar: FNF intact bilaterally  Meningeal signs: Although she does not have frank neck stiffness, she complains of excessive pain on passive movement of her neck. Kernigs sign present with right leg only.  Brudzinski is negative  Labs/Imaging/Neurodiagnostic studies   CBC:  Recent Labs  Lab 02-19-24 1030 01/22/24 1203 01/24/24 0623  WBC 11.1* 9.5 12.6*  NEUTROABS 8.5* 7.6  --   HGB 8.9* 9.0* 9.2*  HCT 27.9* 28.3* 29.1*  MCV 85.1 86.0 85.3  PLT 192 217 310   Basic Metabolic Panel:  Lab Results  Component Value Date   NA 138 01/24/2024   K 3.9 01/24/2024   CO2 18 (L) 01/24/2024   GLUCOSE 91 01/24/2024   BUN 6 01/24/2024   CREATININE 0.77 01/24/2024   CALCIUM 8.3 (L) 01/24/2024   GFRNONAA >60 01/24/2024   GFRAA >60 12/16/2019    INR  Lab Results  Component Value Date   INR 1.0 01/24/2024    CT Lumbar Spine: 1. No definite fluid collection within the lumbar spinal canal. 2. Gas in the  dorsal epidural space consistent with recent instrumentation.  ASSESSMENT   Kathy Harris is a 23 y.o. female with hx of recent c section with epidural who is presenting with continue headache and new onset fever and neck stiffness. She underwent a c-section on 8/4 and and a blood patch on February 19, 2024. Epidural complicated with difficulty with her SAB and had to use a Touhy needle and Spinocan needle. Thursday night her headache worsened. She is now having a pretty consistent headache regardless of positioning. Rates the pain as an 8/10. She also has a sharp shooting electrical pain in right leg. She has significant pain with palpation over her epidural and blood patch site.  Temp 100.8 in MAU. She has started to receive antibiotics for meningitis/encephalitis coverage and she did also receive 1L of LR.  Her exam does raise concern of meningitis although meningeal signs are not extremely clearly elicitable.  Localized tenderness in the lower back and more pain with  right leg elevation raises concern for localized abscess.  Other differentials include ongoing low pressure headache, cysts chemical meningitis versus nerve root irritation on the right but related to the recent procedures with the nerve root irritation does not explain headache.  Differentials to include: Infectious meningitis versus chemical meningitis versus nerve root irritation on the right related to the recent procedures versus lumbar spinal abscess  RECOMMENDATIONS  - LP under flouro-check opening pressure.  Send CSF for cell count x 2 tubes, glucose, protein, meningitis encephalitis panel and Gram stain  - Compazine  10mg  and benadryl  25mg .  If no benefit, after 4 hours can give Toradol /Compazine /Benadryl  IV cocktail again.  Can try 500 mg Solu-Medrol IV x 1 as well.  - MRI L spine w wo  Will follow Plan discussed with Dr. Georgina at the patient's  bedside ______________________________________________________________________  Signed, Jorene Last, NP [facility]  Attending Neurohospitalist Addendum Patient seen and examined with APP/Resident. Agree with the history and physical as documented above. Agree with the plan as documented, which I helped formulate. I have independently reviewed the chart, obtained history, review of systems and examined the patient.I have personally reviewed pertinent head/neck/spine imaging (CT/MRI). Please feel free to call with any questions.  -- Eligio Lav, MD Neurologist [facility] Pager: [phone]

## 2024-01-24 NOTE — Progress Notes (Signed)
 Pharmacy Antibiotic Note  Kathy Harris is a 23 y.o. female admitted on 01/24/2024 with r/o meningitis. Patient is POD6 s/p c-section with spinal..  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  Plan: Acyclovir  10mg /kg IV q8h  = 610 mg IV every 8 hours NS 161ml/hr Monitor clinical progress, renal function, and cultures results. Avoid other nephrotoxic medication   Height: 4' 11 (149.9 cm) Weight: 86.2 kg (190 lb) IBW/kg (Calculated) : 43.2 Adjusted body weight:  60.9 kg  Temp (24hrs), Avg:99.7 F (37.6 C), Min:98.4 F (36.9 C), Max:100.8 F (38.2 C)  Recent Labs  Lab 01/19/24 0746 01/20/24 1030 01/22/24 1203 01/24/24 0505 01/24/24 0623  WBC 13.1* 11.1* 9.5  --  12.6*  CREATININE 0.75 0.76  --  0.77  --     Estimated Creatinine Clearance: 104.3 mL/min (by C-G formula based on SCr of 0.77 mg/dL).    No Known Allergies  Antimicrobials this admission: Cefepime  8/10 >8/10 Vancomycin  8/10 x1 Acyclovir  8/10>>  Dose adjustments this admission:   Microbiology results: 8/10 BCx: ngtd 8/10 CSF cx: pending 8/10 Resp panel by PCR: negative   Thank you for allowing pharmacy to be a part of this patient's care.  Levorn Gaskins, RPh Clinical Pharmacist 01/24/2024 6:20 PM Please check AMION for all [facility] phone numbers After 10:00 PM, call Main Pharmacy [phone]

## 2024-01-24 NOTE — Consult Note (Addendum)
 Initial Consultation Note   Patient: Kathy Harris FMW:[id number] DOB: [birth year] PCP: Patient, No Pcp Per DOA: 01/24/2024 DOS: the patient was seen and examined on 01/24/2024 Primary service: Georgina Basket, MD  Referring physician: Rosaline Chapel Reason for consult: Medicine transfer for post-partum patient admitted with ongoing headache c/f meningitis  Assessment/Plan: Assessment and Plan: 8F who is POD6 s/p c-section with difficult spinal requiring multiple attempts, treated for PDPH on 8/6 with blood patch, now with headache, fever, neck stiffness, and exam/labs concerning for meningitis. [facility] will take over as primary team on 8/11.  Intractable headache Neck stiffness Headache HSV meningitis -Neurology following; apprec eval/recs -ID consulted; recs: OK for IV acyclovir  alone; can d/c vancomyin and cefepime  -Start IV acyclovir  per pharmacy -D/c IV vancomycin  and IV cefepime  for now   [facility] will continue to follow the patient.  HPI: Kathy Harris is a 23 y.o. female who is POD6 s/p c-section with difficult spinal requiring multiple attempts, treated for PDPH on 8/6 with blood patch, now with headache, fever, neck stiffness, and exam/labs concerning for meningitis.   Per Dr. Arora, She underwent a c-section on 8/4 and and a blood patch on 8/6. She returned to MAU due to headache, fever, neck stiffness. Plan for lumbar puncture with IR.  Epidural complicated with difficulty with her SAB and had to use a Touhy needle and Spinocan needle. She reports that her headache improved Wednesday night into Thursday morning and she was discharged. Thursday night her headache worsened. She is now having a pretty consistent headache regardless of positioning. Rates the pain as an 8/10. She also has pain and weakness in her right leg. She endorses a sharp shooting electrical pain. She has significant pain with palpation over her epidural and blood patch site   Review of Systems: As  mentioned in the history of present illness. All other systems reviewed and are negative. Past Medical History:  Diagnosis Date   Anemia    [employer] 05/31/2020   Cesarean delivery delivered 06/05/2020   Encounter for routine postpartum follow-up 07/13/2020   Non-reassuring fetal heart rate or rhythm affecting management of mother 06/05/2020   Supervision of high risk pregnancy in second trimester 02/01/2020   Term pregnancy 01/22/2022   Past Surgical History:  Procedure Laterality Date   CESAREAN SECTION     CESAREAN SECTION N/A 01/22/2022   Procedure: CESAREAN SECTION;  Surgeon: Okey Leader, MD;  Location: [facility];  Service: Obstetrics;  Laterality: N/A;   CESAREAN SECTION N/A 01/18/2024   Procedure: CESAREAN DELIVERY;  Surgeon: Okey Leader, MD;  Location: [facility];  Service: Obstetrics;  Laterality: N/A;   CESAREAN SECTION MULTI-GESTATIONAL N/A 06/05/2020   Procedure: CESAREAN SECTION MULTI-GESTATIONAL;  Surgeon: Barbra Lang PARAS, DO;  Location: [facility];  Service: Obstetrics;  Laterality: N/A;   TONSILLECTOMY     WISDOM TOOTH EXTRACTION     Social History:  reports that she has never smoked. She has never used smokeless tobacco. She reports that she does not drink alcohol and does not use drugs.  No Known Allergies  Family History  Problem Relation Age of Onset   Heart murmur Mother    Diabetes Father    Hypertension Father     Prior to Admission medications   Medication Sig Start Date End Date Taking? Authorizing Provider  acetaminophen -caffeine  (EXCEDRIN TENSION HEADACHE) 500-65 MG TABS per tablet Take 1 tablet by mouth every 8 (eight) hours as needed. 01/22/24  Yes Jhonny Augustin BROCKS, MD  cyclobenzaprine  (FLEXERIL )  5 MG tablet Take 1 tablet (5 mg total) by mouth 3 (three) times daily as needed for up to 7 days for muscle spasms. Patient not taking: Reported on 01/24/2024 01/22/24 01/29/24  Jhonny Augustin BROCKS, MD    Physical Exam: Vitals:   01/24/24 9388 01/24/24 0750 01/24/24  1125 01/24/24 1146  BP: 129/74 134/82 131/87 (!) 110/59  Pulse: 70 67 63 69  Resp: 16 20 18 16   Temp: 98.7 F (37.1 C) (!) 100.8 F (38.2 C) 100 F (37.8 C) 98.4 F (36.9 C)  TempSrc: Oral Oral Oral Oral  SpO2:    99%  Weight:      Height:       General: Alert, oriented x3, resting comfortably in no acute distress Respiratory: Lungs clear to auscultation bilaterally with normal respiratory effort; no w/r/r Cardiovascular: Regular rate and rhythm w/o m/r/g   Data Reviewed:   Lab Results  Component Value Date   WBC 12.6 (H) 01/24/2024   HGB 9.2 (L) 01/24/2024   HCT 29.1 (L) 01/24/2024   MCV 85.3 01/24/2024   PLT 310 01/24/2024   Lab Results  Component Value Date   GLUCOSE 91 01/24/2024   CALCIUM 8.3 (L) 01/24/2024   NA 138 01/24/2024   K 3.9 01/24/2024   CO2 18 (L) 01/24/2024   CL 107 01/24/2024   BUN 6 01/24/2024   CREATININE 0.77 01/24/2024   Lab Results  Component Value Date   ALT 24 01/24/2024   AST 23 01/24/2024   ALKPHOS 87 01/24/2024   BILITOT 0.6 01/24/2024   Lab Results  Component Value Date   INR 1.0 01/24/2024    Radiology: CT LUMBAR SPINE WO CONTRAST Result Date: 01/24/2024 EXAM: CT OF THE LUMBAR SPINE WITHOUT CONTRAST 01/24/2024 05:16:19 AM TECHNIQUE: CT of the lumbar spine was performed without the administration of intravenous contrast. Multiplanar reformatted images are provided for review. Automated exposure control, iterative reconstruction, and/or weight based adjustment of the mA/kV was utilized to reduce the radiation dose to as low as reasonably achievable. COMPARISON: None available. CLINICAL HISTORY: S/p spinal for C/S. Concern for abscess/meningitis. Needs CT prior to LP. FINDINGS: BONES AND ALIGNMENT: Normal vertebral body heights. No acute fracture or suspicious bone lesion. Normal alignment. DEGENERATIVE CHANGES: Mild disc bulging is present at L3-4 and L4-5. SOFT TISSUES: Gas is present in the dorsal epidural space consistent with  recent instrumentation. No definite fluid collection is present within the lumbar spinal canal. Blood products are present within an enlarged uterus consistent with recent c-section. IMPRESSION: 1. No definite fluid collection within the lumbar spinal canal. 2. Gas in the dorsal epidural space consistent with recent instrumentation. Electronically signed by: Lonni Necessary MD 01/24/2024 05:42 AM EDT RP Workstation: [id number]   CT HEAD WO CONTRAST ( ) Result Date: 01/24/2024 EXAM: CT HEAD WITHOUT CONTRAST 01/24/2024 05:15:59 AM TECHNIQUE: CT of the head was performed without the administration of intravenous contrast. Automated exposure control, iterative reconstruction, and/or weight based adjustment of the mA/kV was utilized to reduce the radiation dose to as low as reasonably achievable. COMPARISON: None available. CLINICAL HISTORY: Headache and fever. Status post spinal anesthesia for cesarean section. Concern for meningitis. FINDINGS: BRAIN AND VENTRICLES: No acute hemorrhage. Gray-white differentiation is preserved. No hydrocephalus. No extra-axial collection. No mass effect or midline shift. ORBITS: No acute abnormality. SINUSES: No acute abnormality. SOFT TISSUES AND SKULL: No acute soft tissue abnormality. No skull fracture. IMPRESSION: 1. No acute intracranial abnormality. Electronically signed by: Lonni Necessary MD 01/24/2024 05:39 AM EDT RP Workstation:  [id number]     Family Communication: Husband Primary team communication: [facility] will take over as primary team on 8/11  Thank you very much for involving us  in the care of your patient.  ------- I spent 45 minutes reviewing previous notes, at the bedside counseling/discussing the treatment plan, and performing clinical documentation.  Author: Marsha Ada, MD 01/24/2024 3:04 PM  For on call review [email or web address].

## 2024-01-24 NOTE — Progress Notes (Signed)
 Pharmacy Antibiotic Note  Kathy Harris is a 23 y.o. female admitted on 01/24/2024 with r/o meningitis.  Pt recently had cesarean section on 8/4. Subsequently, had a spinal headache with blood patch placed by anesthesia.  She returns to MAU with  headache, neck pain, LE muscle spasms, and fever.  Meningitis workup underway. Pharmacy has been consulted for vancomycin  dosing.  Plan:. Vancomycin  loading dose 1750 mg IV x 1 followed by  Vancomycin  1500mg  IV every 12 hours.  Goal trough 15-20 mcg/mL.  Height: 4' 11 (149.9 cm) Weight: 86.2 kg (190 lb) IBW/kg (Calculated) : 43.2  Temp (24hrs), Avg:100.7 F (38.2 C), Min:100.7 F (38.2 C), Max:100.7 F (38.2 C)  Recent Labs  Lab 01/19/24 0746 01/20/24 1030 01/22/24 1203  WBC 13.1* 11.1* 9.5  CREATININE 0.75 0.76  --     Estimated Creatinine Clearance: 104.3 mL/min (by C-G formula based on SCr of 0.76 mg/dL).    No Known Allergies  Antimicrobials this admission: Cefepime  2g Q8 8/10 >>  vancomycin  8/10 >>    Microbiology results: 8/10 BCx: p  Thank you for allowing pharmacy to be a part of this patient's care.  Ernesto Annabella Keels 01/24/2024 5:24 AM

## 2024-01-24 NOTE — MAU Note (Addendum)
 Kathy Harris is a 23 y.o. at Unknown here in MAU reporting: here by EMS - PP c/s on 8/4 reports having blood patch done on 8/6 and d/c home. Has continued to have shooting pain up her entire spine and throbbing HA that has gotten worse. Reports taking excedrin 8 hours ago - was previously working, but this time it did not. States laying down usually helps the pain, but now it does not relieve the pain. EMT reports bolus of LR and 100mcg given to pt on the truck. Pt also reports chills and weakness in her legs.   LMP: NA Onset of complaint: on going Pain score: 10  Vitals:   01/24/24 0416  BP: 131/68  Pulse: 78  Resp: 20  Temp: (!) 100.7 F (38.2 C)  SpO2: 99%     FHT: NA  Lab orders placed from triage: none

## 2024-01-25 DIAGNOSIS — B1009 Other human herpesvirus encephalitis: Secondary | ICD-10-CM | POA: Diagnosis present

## 2024-01-25 LAB — BASIC METABOLIC PANEL WITH GFR
Anion gap: 9 (ref 5–15)
BUN: <5 mg/dL — ABNORMAL LOW (ref 6–20)
CO2: 19 mmol/L — ABNORMAL LOW (ref 22–32)
Calcium: 8 mg/dL — ABNORMAL LOW (ref 8.9–10.3)
Chloride: 109 mmol/L (ref 98–111)
Creatinine, Ser: 0.67 mg/dL (ref 0.44–1.00)
GFR, Estimated: >60 mL/min (ref >60)
Glucose, Bld: 99 mg/dL (ref 70–99)
Potassium: 3.4 mmol/L — ABNORMAL LOW (ref 3.5–5.1)
Sodium: 137 mmol/L (ref 135–145)

## 2024-01-25 LAB — PATHOLOGIST SMEAR REVIEW

## 2024-01-25 MED ORDER — FERROUS SULFATE 325 (65 FE) MG PO TABS
325.0000 mg | ORAL_TABLET | Freq: Every day | ORAL | Status: DC
  Administered 2024-01-26 (×2): 325 mg via ORAL
  Filled 2024-01-25: qty 1

## 2024-01-25 MED ORDER — B COMPLEX-C PO TABS
1.0000 | ORAL_TABLET | Freq: Every day | ORAL | Status: DC
  Administered 2024-01-25 – 2024-01-26 (×4): 1 via ORAL
  Filled 2024-01-25 (×2): qty 1

## 2024-01-25 MED ORDER — DOCUSATE SODIUM 100 MG PO CAPS
100.0000 mg | ORAL_CAPSULE | Freq: Two times a day (BID) | ORAL | Status: DC
  Administered 2024-01-25 (×4): 100 mg via ORAL
  Filled 2024-01-25 (×3): qty 1

## 2024-01-25 MED ORDER — POTASSIUM CHLORIDE CRYS ER 20 MEQ PO TBCR
40.0000 meq | EXTENDED_RELEASE_TABLET | Freq: Once | ORAL | Status: AC
  Administered 2024-01-25 (×2): 40 meq via ORAL
  Filled 2024-01-25: qty 2

## 2024-01-25 MED ORDER — SODIUM CHLORIDE 0.9 % IV SOLN: INTRAVENOUS | Status: DC

## 2024-01-25 NOTE — Plan of Care (Signed)
  Problem: Clinical Measurements: Goal: Respiratory complications will improve Outcome: Progressing Goal: Cardiovascular complication will be avoided Outcome: Progressing   Problem: Activity: Goal: Risk for activity intolerance will decrease Outcome: Progressing   Problem: Nutrition: Goal: Adequate nutrition will be maintained Outcome: Progressing   Problem: Elimination: Goal: Will not experience complications related to urinary retention Outcome: Progressing   Problem: Pain Managment: Goal: General experience of comfort will improve and/or be controlled Outcome: Progressing   Problem: Clinical Measurements: Goal: Will remain free from infection Outcome: Not Progressing

## 2024-01-25 NOTE — Progress Notes (Signed)
 [facility] Progress Note Patient: Emberli Ballester FMW:[id number] DOB: [birth year] DOA: 01/24/2024  DOS: the patient was seen and examined on 01/25/2024  Brief Hospital Course: Patient with no significant PMH with recent C-section with epidural presenting with complaints of headache and new onset fever as well as neck stiffness. C-section was on 8/4. Reported headache and therefore underwent blood patch treatment on 8/6. Discharged home on 8/6 7. Return to the MAU on 8/10.  Discussed with neurologist Dr. Michaela was admitted to hospital on the obese side but later on OB requested a hospitalist admission with a consult. Underwent LP.  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 Started on acyclovir . ID following. Assessment and Plan: HSV encephalitis/meningitis. Currently reports improvement in headache with some blurred vision and photophobia. No neck stiffness. Started on IV acyclovir  with IV fluid. Appreciate ID consultation as well as neuroconsultation. Continue with IV acyclovir  with eventual plan to transition to oral Valtrex  for a total 7-day treatment course. Discontinue droplet precautions initiated as part of the meningitis order set. No indication for steroids. Monitor BMP.  Recent C-section. OB provider Dr. Diedre Sermon with Dr. Liguori (Neonatologist) regarding HSV2 encephalitis finding and concern for possible exposure for the baby. Per patient report, baby is acting normal and well. Dr. Maryland will call his pediatrician's office in the morning to make recommendation for outpatient HSV testing. If changes to baby's behavior or low temperature, he could be tested in the emergency room. Dr. Maryland also recommends HSV 2 IgG/IgM testing for Vyla as this would help stratify risk for [name].  This information was shared with patient.  Mild hypokalemia. Replacing orally.  Anemia, normocytic. H&H relatively stable. Initiating prenatal  vitamins.  Constipation. Initiate bowel regimen.   Subjective: No nausea no vomiting no fever no chills.  No improving headache.  Only back pain right now.  Reports some blurred vision.  Physical Exam: Clear to auscultation. S1-S2 present Pupils are equal and reactive to light with No neck stiffness. No edema.  Data Reviewed: I have Reviewed nursing notes, Vitals, and Lab results. Since last encounter, pertinent lab results CBC and BMP   . I have ordered test including CBC and BMP  . I have discussed pt's care plan and test results with ID  .   Disposition: Status is: Inpatient Remains inpatient appropriate because: Monitor for improvement in infection  Place and maintain sequential compression device Start: 01/25/24 1027   Family Communication: No one at bedside Level of care: Med-Surg   Vitals:   01/24/24 1146 01/24/24 1956 01/25/24 0439 01/25/24 1500  BP: (!) 110/59 114/69 125/73 126/77  Pulse: 69 69 70   Resp: 16 17 18    Temp: 98.4 F (36.9 C) (!) 100.8 F (38.2 C) 98.5 F (36.9 C)   TempSrc: Oral Oral Oral   SpO2: 99% 98% 99%   Weight:      Height:         Author: Yetta Blanch, MD 01/25/2024 5:41 PM  Please look on [email or web address] to find out who is on call.

## 2024-01-25 NOTE — Consult Note (Addendum)
 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 female with PMH of anemia, s/p C- section POD 7 who presents with headache, neck stiffness, and photophobia. Patient reports that she gave birth via c- section with vacuum assistance on Monday. Patient was given epidural at that time but did take a few attempts with different needle types to place successfully. Patient reports that this had not happened before with her previous pregnancies. She developed a headache and nausea after epidural placement. At first headache was able to be relieved with change in position, specifically relived by supine position. Epidural blood patch placed for PDPH. Patient reports that headache got better temporarily with patch. Later on 8/7 patient reports that symptoms of headache, neck stiffness, and photophobia came back along with fever. Patient denies any confusion at any point. She also noted that she was having some muscle spasms down her right leg that started once she was discharged. Things got progressively worse and on Saturday night patient decided that she needed to go back to the hospital. Admitted with concern for meningitis. Neurology consulted  and patient had IR LP which showed lymphocyte dominance. Patient was initially started on broad spectrum abx. No prior history of painful lesions, and only endorses hx of UTIs and chlamydia which has been treated. Patient states today that her headache, neck stiffness, and photophobia are better. She does endorse an episode of fever last night. She denies any nausea, vomiting and says she has good PO intake. She is currently pumping breast milk and keeping it refrigerated.  Past Medical  History:  Diagnosis Date   Anemia    [employer]reesia 05/31/2020   Cesarean delivery delivered 06/05/2020   Encounter for routine postpartum follow-up 07/13/2020   Non-reassuring fetal heart rate or rhythm affecting management of mother 06/05/2020   Supervision of high risk pregnancy in second trimester 02/01/2020   Term pregnancy 01/22/2022    Allergies: No Known Allergies  Current medications: Acyclovir  q8hr    MEDICATIONS:  docusate sodium   100 mg Oral BID    Social History   Tobacco Use   Smoking status: Never   Smokeless tobacco: Never  Vaping Use   Vaping status: Never Used  Substance Use Topics   Alcohol use: Never   Drug use: Never    Family History  Problem Relation Age of Onset   Heart murmur Mother    Diabetes Father    Hypertension Father     Review of Systems - Negative except as noted in HPI    OBJECTIVE: Temp:  [98.5 F (36.9 C)-100.8 F (38.2 C)] 99 F (37.2 C) (08/11 0757) Pulse Rate:  [69-77] 77 (08/11 0757) Resp:  [17-18] 18 (08/11 0757) BP: (114-125)/(69-73) 123/70 (08/11 0757) SpO2:  [97 %-99 %] 97 % (08/11 0757) Physical Exam Constitutional:      General: She is not in acute distress.    Appearance: She is not toxic-appearing.  Neck:     Meningeal: Brudzinski's sign absent.     Comments: Limited flexion due due to neck stiffness. Mild tenderness to palpation in C- spine Cardiovascular:     Rate and Rhythm: Normal rate and regular rhythm.  Heart sounds: No murmur heard. Pulmonary:     Effort: Pulmonary effort is normal. No respiratory distress.     Breath sounds: No wheezing.  Abdominal:     Palpations: Abdomen is soft.     Tenderness: There is no abdominal tenderness. There is no guarding.  Neurological:     Mental Status: She is alert.  Psychiatric:        Mood and Affect: Mood normal.      LABS: Results for orders placed or performed during the hospital encounter of 01/24/24 (from the past 48 hours)  Culture, blood  (Routine X 2) w Reflex to ID Panel     Status: None (Preliminary result)   Collection Time: 01/24/24  4:56 AM   Specimen: BLOOD RIGHT HAND  Result Value Ref Range   Specimen Description BLOOD RIGHT HAND    Special Requests      BOTTLES DRAWN AEROBIC AND ANAEROBIC Blood Culture results may not be optimal due to an inadequate volume of blood received in culture bottles   Culture      NO GROWTH 1 DAY Performed at [facility], [address], [place], [place] [address]    Report Status PENDING   Culture, blood (Routine X 2) w Reflex to ID Panel     Status: None (Preliminary result)   Collection Time: 01/24/24  4:56 AM   Specimen: BLOOD RIGHT ARM  Result Value Ref Range   Specimen Description BLOOD RIGHT ARM    Special Requests      BOTTLES DRAWN AEROBIC AND ANAEROBIC Blood Culture results may not be optimal due to an inadequate volume of blood received in culture bottles   Culture      NO GROWTH 1 DAY Performed at [facility], [address], [place], [place] [address]    Report Status PENDING   Type and screen [facility]     Status: None   Collection Time: 01/24/24  4:59 AM  Result Value Ref Range   ABO/RH(D) AB POS    Antibody Screen NEG    Sample Expiration      01/27/2024,2359 Performed at [facility], [address], [place], [place] [address]   Comprehensive metabolic panel     Status: Abnormal   Collection Time: 01/24/24  5:05 AM  Result Value Ref Range   Sodium 138 135 - 145 mmol/L   Potassium 3.9 3.5 - 5.1 mmol/L   Chloride 107 98 - 111 mmol/L   CO2 18 (L) 22 - 32 mmol/L   Glucose, Bld 91 70 - 99 mg/dL    Comment: Glucose reference range applies only to samples taken after fasting for at least 8 hours.   BUN 6 6 - 20 mg/dL   Creatinine, Ser 9.22 0.44 - 1.00 mg/dL   Calcium 8.3 (L) 8.9 - 10.3 mg/dL   Total Protein 5.5 (L) 6.5 - 8.1 g/dL   Albumin 2.0 (L) 3.5 - 5.0 g/dL   AST 23 15 - 41 U/L   ALT 24 0 - 44 U/L   Alkaline  Phosphatase 87 38 - 126 U/L   Total Bilirubin 0.6 0.0 - 1.2 mg/dL   GFR, Estimated >39 >39 mL/min    Comment: (NOTE) Calculated using the CKD-EPI Creatinine Equation (2021)    Anion gap 13 5 - 15    Comment: Performed at [facility], [address] [place], [place] [address]  C-reactive protein     Status: Abnormal  Collection Time: 01/24/24  5:05 AM  Result Value Ref Range   CRP 5.5 (H) <1.0 mg/dL    Comment: Performed at [facility], [address], [place], [place] [address]  CBC     Status: Abnormal   Collection Time: 01/24/24  6:23 AM  Result Value Ref Range   WBC 12.6 (H) 4.0 - 10.5 K/uL   RBC 3.41 (L) 3.87 - 5.11 MIL/uL   Hemoglobin 9.2 (L) 12.0 - 15.0 g/dL   HCT 70.8 (L) 63.9 - 53.9 %   MCV 85.3 80.0 - 100.0 fL   MCH 27.0 26.0 - 34.0 pg   MCHC 31.6 30.0 - 36.0 g/dL   RDW 84.9 88.4 - 84.4 %   Platelets 310 150 - 400 K/uL   nRBC 0.0 0.0 - 0.2 %    Comment: Performed at [facility], [address] [place], [place] [address]  Protime-INR     Status: None   Collection Time: 01/24/24  6:23 AM  Result Value Ref Range   Prothrombin Time 13.5 11.4 - 15.2 seconds   INR 1.0 0.8 - 1.2    Comment: (NOTE) INR goal varies based on device and disease states. Performed at [facility], [address] [place], [place] [address]   Urinalysis, Routine w reflex microscopic -Urine, Clean Catch     Status: Abnormal   Collection Time: 01/24/24  7:01 AM  Result Value Ref Range   Color, Urine YELLOW YELLOW   APPearance CLEAR CLEAR   Specific Gravity, Urine 1.014 1.005 - 1.030   pH 7.0 5.0 - 8.0   Glucose, UA NEGATIVE NEGATIVE mg/dL   Hgb urine dipstick MODERATE (A) NEGATIVE   Bilirubin Urine NEGATIVE NEGATIVE   Ketones, ur NEGATIVE NEGATIVE mg/dL   Protein, ur NEGATIVE NEGATIVE mg/dL   Nitrite NEGATIVE NEGATIVE   Leukocytes,Ua SMALL (A) NEGATIVE   RBC / HPF 0-5 0 - 5 RBC/hpf   WBC, UA 6-10 0 - 5 WBC/hpf   Bacteria, UA RARE (A) NONE SEEN   Squamous  Epithelial / HPF 0-5 0 - 5 /HPF   Mucus PRESENT     Comment: Performed at [facility], [address], [place], [place] [address]  Respiratory (~20 pathogens) panel by PCR     Status: None   Collection Time: 01/24/24 11:16 AM   Specimen: Nasopharyngeal Swab; Respiratory  Result Value Ref Range   Adenovirus NOT DETECTED NOT DETECTED   Coronavirus 229E NOT DETECTED NOT DETECTED    Comment: (NOTE) The Coronavirus on the Respiratory Panel, DOES NOT test for the novel  Coronavirus (2019 nCoV)    Coronavirus HKU1 NOT DETECTED NOT DETECTED   Coronavirus NL63 NOT DETECTED NOT DETECTED   Coronavirus OC43 NOT DETECTED NOT DETECTED   Metapneumovirus NOT DETECTED NOT DETECTED   Rhinovirus / Enterovirus NOT DETECTED NOT DETECTED   Influenza A NOT DETECTED NOT DETECTED   Influenza B NOT DETECTED NOT DETECTED   Parainfluenza Virus 1 NOT DETECTED NOT DETECTED   Parainfluenza Virus 2 NOT DETECTED NOT DETECTED   Parainfluenza Virus 3 NOT DETECTED NOT DETECTED   Parainfluenza Virus 4 NOT DETECTED NOT DETECTED   Respiratory Syncytial Virus NOT DETECTED NOT DETECTED   Bordetella pertussis NOT DETECTED NOT DETECTED   Bordetella Parapertussis NOT DETECTED NOT DETECTED   Chlamydophila pneumoniae NOT DETECTED NOT DETECTED   Mycoplasma pneumoniae NOT DETECTED NOT DETECTED    Comment: Performed at [facility], [address] [place],  [place] [address]  CSF cell count with differential collection tube #: 1     Status: Abnormal   Collection Time: 01/24/24  1:12 PM  Result Value Ref Range   Tube # 1    Color, CSF PINK (A) COLORLESS   Appearance, CSF TURBID (A) CLEAR   Supernatant COLORLESS    RBC Count, CSF 5,000 (H) 0 /cu mm   WBC, CSF 223 (HH) 0 - 5 /cu mm    Comment: CRITICAL RESULT CALLED TO, READ BACK BY AND VERIFIED WITH: CLAYBORNE MAID, LPN ON [id number] AT 1655 BY [employer]LL [employer]    Segmented Neutrophils-CSF 26 (H) 0 - 6 %   Lymphs, CSF 62 40 - 80 %   Monocyte-Macrophage-Spinal  Fluid 12 (L) 15 - 45 %   Eosinophils, CSF 0 0 - 1 %    Comment: Performed at [facility], [address], [place], [place] [address]  CSF culture w Gram Stain     Status: None (Preliminary result)   Collection Time: 01/24/24  1:12 PM   Specimen: Lumbar Puncture; Cerebrospinal Fluid  Result Value Ref Range   Specimen Description CSF    Special Requests NONE    Gram Stain      WBC PRESENT,BOTH PMN AND MONONUCLEAR NO ORGANISMS SEEN CYTOSPIN SMEAR    Culture      NO GROWTH < 24 HOURS Performed at [facility], [address] [place], [place] [address]    Report Status PENDING   Protein and glucose, CSF     Status: Abnormal   Collection Time: 01/24/24  1:12 PM  Result Value Ref Range   Glucose, CSF 32 (L) 40 - 70 mg/dL   Total  Protein, CSF 56 (H) 15 - 45 mg/dL    Comment: Performed at [facility], [address], [place], [place] [address]  Meningitis/Encephalitis Panel (CSF)     Status: Abnormal   Collection Time: 01/24/24  1:12 PM  Result Value Ref Range   Cryptococcus neoformans/gattii (CSF) NOT DETECTED NOT DETECTED    Comment: (NOTE) Patients with a suspicion of cryptococcal meningitis should be tested  for cryptococcal antigen (CrAg).      Cytomegalovirus (CSF) NOT DETECTED NOT DETECTED   Enterovirus (CSF) NOT DETECTED NOT DETECTED   Escherichia coli K1 (CSF) NOT DETECTED NOT DETECTED    Comment: (NOTE) Only E. coli strains possessing the K1 capsular antigen will be detected.      Haemophilus influenzae (CSF) NOT DETECTED NOT DETECTED   Herpes simplex virus 1 (CSF) NOT DETECTED NOT DETECTED   Herpes simplex virus 2 (CSF) DETECTED (A) NOT DETECTED    Comment: CRITICAL RESULT CALLED TO, READ BACK BY AND VERIFIED WITH: RN CLAYBORNE MAID [id number] AT 1720 BY EC (NOTE) False detection may occur due to reactivation of latent virus from previous infection and should only be considered within appropriate clinical context.      Human herpesvirus 6 (CSF)  NOT DETECTED NOT DETECTED   Human parechovirus (CSF) NOT DETECTED NOT DETECTED   Listeria monocytogenes (CSF) NOT DETECTED NOT DETECTED   Neisseria meningitis (CSF) NOT DETECTED NOT DETECTED    Comment: (NOTE) Only encapsulated strains of N. meningitidis will be detected.     Streptococcus agalactiae (CSF) NOT DETECTED NOT DETECTED   Streptococcus pneumoniae (CSF) NOT DETECTED NOT DETECTED   Varicella zoster virus (CSF) NOT DETECTED NOT DETECTED    Comment: Performed at [facility], [address] [place], [place] [address]  CSF cell count with differential collection tube #: 4     Status: Abnormal   Collection Time: 01/24/24  1:13 PM  Result Value Ref Range   Tube # 4    Color, CSF PINK (A) COLORLESS   Appearance, CSF TURBID (A) CLEAR   Supernatant COLORLESS    RBC Count, CSF 2,000 (H) 0 /cu mm   WBC, CSF 237 (HH) 0 - 5 /cu mm    Comment: CRITICAL RESULT CALLED TO, READ BACK BY AND VERIFIED WITH: ANGEL MORENO, LPN ON 91/89/7974 AT 1654 BY SWEETSELL CUSTODIO    Segmented Neutrophils-CSF 30 (H) 0 - 6 %   Lymphs, CSF 57 40 - 80 %   Monocyte-Macrophage-Spinal Fluid 13 (L) 15 - 45 %   Eosinophils, CSF 0 0 - 1 %    Comment: Performed at [facility], [address] [place], [place] [address]  Basic metabolic panel     Status: Abnormal   Collection Time: 01/25/24  7:08 AM  Result Value Ref Range   Sodium 137 135 - 145 mmol/L   Potassium 3.4 (L) 3.5 - 5.1 mmol/L   Chloride 109 98 - 111 mmol/L   CO2 19 (L) 22 - 32 mmol/L   Glucose, Bld 99 70 - 99 mg/dL    Comment: Glucose reference range applies only to samples taken after fasting for at least 8 hours.   BUN <5 (L) 6 - 20 mg/dL   Creatinine, Ser 9.32 0.44 - 1.00 mg/dL   Calcium 8.0 (L) 8.9 - 10.3 mg/dL   GFR, Estimated >39 >39 mL/min    Comment: (NOTE) Calculated using the CKD-EPI Creatinine Equation (2021)    Anion gap 9 5 - 15    Comment: Performed at [facility], [address], [place], [place]  [address]    MICRO:  IMAGING: MR Lumbar Spine W Wo Contrast Result Date: 01/24/2024 EXAM: MRI LUMBAR SPINE 01/24/2024 04:33:03 PM TECHNIQUE: Multiplanar multisequence MRI of the lumbar spine was performed with and without the administration of intravenous contrast. 8.14mL of gadobutrol  (GADAVIST ) 1 MMOL/ML was injected. COMPARISON: None available. CLINICAL HISTORY: Abcess r/o. FINDINGS: BONES AND ALIGNMENT: Lumbar lordosis is maintained. No spondylolisthesis. No bone marrow edema or evidence of fracture. Vertebral body heights are maintained. No suspicious osseous lesion. SPINAL CORD: The conus medullaris terminates at the L1-2 level. SOFT TISSUES: There is heterogeneous signal intensity along the dorsal aspect of the spinal canal at L3-4 with a possible locule of gas within this region, likely reflecting post-procedure changes related to recent lumbar puncture and epidural blood patch. There is enhancement along the dorsal aspect of the spinal canal extending from the level of L2-3 to the mid L5 level, also likely reflecting post-procedure changes. Within this region, there is no evidence of peripheral enhancing fluid collection to suggest epidural abscess. The paraspinal soft tissues are otherwise unremarkable. L1-L2: No significant spinal canal or foraminal stenosis. L2-L3: No significant spinal canal or foraminal stenosis. L3-L4: No significant spinal canal or foraminal stenosis. L4-L5: No significant spinal canal or foraminal stenosis. L5-S1: There is prominence of the epidural fat with tapering of the thecal sac without high-grade spinal canal stenosis. No significant foraminal stenosis. IMPRESSION: 1. No evidence of epidural abscess. 2. Post-procedure changes related to recent lumbar puncture and epidural blood patch, with enhancement along the dorsal aspect of the spinal canal from L2-3 to mid L5 level. 3. No high-grade spinal canal or foraminal stenosis. 4. Cholelithiasis. 5. Finding of epidural  enhancement favored  to reflect post procedure changes and no evidence of abscess discussed with Dr. Voncile at 6:17PM on 01/24/24. Electronically signed by: Donnice Mania MD 01/24/2024 06:46 PM EDT RP Workstation: [id number]   DG FL GUIDED LUMBAR PUNCTURE Result Date: 01/24/2024 CLINICAL DATA:  23 year old female with headaches, neck pain. IR was requested for LP for meningitis evaluation. EXAM: LUMBAR PUNCTURE UNDER FLUOROSCOPY PROCEDURE: An appropriate skin entry site was determined fluoroscopically. Operator donned sterile gloves and mask. Skin site was marked, then prepped with Betadine , draped in usual sterile fashion, and infiltrated locally with 1% lidocaine . A 20 gauge spinal needle advanced into the thecal sac at L4-L5 from a left interlaminar approach. Clear colorless CSF spontaneously returned, with opening pressure of 29 cm water. 8.5 ml CSF were collected and divided among 4 sterile vials for the requested laboratory studies. The needle was then removed. The patient tolerated the procedure well and there were no complications. FLUOROSCOPY: Radiation Exposure Index (as provided by the fluoroscopic device): 12.6 mGy Kerma IMPRESSION: Technically successful lumbar puncture under fluoroscopy. This exam was performed by Carlin Griffon, PA-C, and was supervised and interpreted by Dr. Juliene Balder. Electronically Signed   By: Juliene Balder M.D.   On: 01/24/2024 16:09   CT LUMBAR SPINE WO CONTRAST Result Date: 01/24/2024 EXAM: CT OF THE LUMBAR SPINE WITHOUT CONTRAST 01/24/2024 05:16:19 AM TECHNIQUE: CT of the lumbar spine was performed without the administration of intravenous contrast. Multiplanar reformatted images are provided for review. Automated exposure control, iterative reconstruction, and/or weight based adjustment of the mA/kV was utilized to reduce the radiation dose to as low as reasonably achievable. COMPARISON: None available. CLINICAL HISTORY: S/p spinal for C/S. Concern for abscess/meningitis. Needs  CT prior to LP. FINDINGS: BONES AND ALIGNMENT: Normal vertebral body heights. No acute fracture or suspicious bone lesion. Normal alignment. DEGENERATIVE CHANGES: Mild disc bulging is present at L3-4 and L4-5. SOFT TISSUES: Gas is present in the dorsal epidural space consistent with recent instrumentation. No definite fluid collection is present within the lumbar spinal canal. Blood products are present within an enlarged uterus consistent with recent c-section. IMPRESSION: 1. No definite fluid collection within the lumbar spinal canal. 2. Gas in the dorsal epidural space consistent with recent instrumentation. Electronically signed by: Lonni Necessary MD 01/24/2024 05:42 AM EDT RP Workstation: [id number]   CT HEAD WO CONTRAST ( ) Result Date: 01/24/2024 EXAM: CT HEAD WITHOUT CONTRAST 01/24/2024 05:15:59 AM TECHNIQUE: CT of the head was performed without the administration of intravenous contrast. Automated exposure control, iterative reconstruction, and/or weight based adjustment of the mA/kV was utilized to reduce the radiation dose to as low as reasonably achievable. COMPARISON: None available. CLINICAL HISTORY: Headache and fever. Status post spinal anesthesia for cesarean section. Concern for meningitis. FINDINGS: BRAIN AND VENTRICLES: No acute hemorrhage. Gray-white differentiation is preserved. No hydrocephalus. No extra-axial collection. No mass effect or midline shift. ORBITS: No acute abnormality. SINUSES: No acute abnormality. SOFT TISSUES AND SKULL: No acute soft tissue abnormality. No skull fracture. IMPRESSION: 1. No acute intracranial abnormality. Electronically signed by: Lonni Necessary MD 01/24/2024 05:39 AM EDT RP Workstation: [id number]    HISTORICAL MICRO/IMAGING  Assessment/Plan:     Kathy Harris is a 23 y.o. female with PMH of anemia, s/p C- section POD 7 who presents with headache, neck stiffness, and photophobia and admitted for meningitis work up and treatment .    #HSV-2 Encephalitis  S/p c- section with epidural placement POD 7  Patient presented this weekend due to worsening headache, neck stiffness, fever  and photophobia. She is POD 7 after vacuum assisted C- section with epidural placement and subsequent epidural blood patch placement. Neurology was consulted with concern for meningitis and patient underwent IR LP yesterday. Patient reports that headache, neck stiffness, and photophobia are better but still present today.   - CSF fluid analysis showed lymphocyte predominance at 60%, protein 56, WBC showed 223 and 237. These results with lymphocyte predominance and mildly elevated protein are consistent with viral or aseptic meningitis.  -CSF culture with gram stain showed WBC present, with both PMN and mononuclear cells but no organisms seen.  - CRP elevated at 5.5  - Blood cultures show no growth at 1 day - MRI of lumbar spine showed no evidence of epidural abscess and post procedure changes related to lumbar puncture and epidural blood patch with enhancement along dorsal aspect of spinal canal from L2-L3 to mid L5 level  - Patient received 2 doses of cefepime  and 1 of vancomycin  yesterday, with transition to acyclovir  last night  Plan:  -  Day 2 of acyclovir  today.Continue on acyclovir  IV q8hr -  Will likely transition to PO valcyclovir 1g TID for 7 days starting tomorrow pending continued symptom improvement and no recurrent episodes of fever. Patient's last fever of 100.8 was noted at around 8 pm last night.  -Continue to follow blood cultures  - Continue to watch fever curve and monitor for any worsening symptoms.

## 2024-01-25 NOTE — Hospital Course (Addendum)
 Patient with no significant PMH with recent C-section with epidural presenting with complaints of headache and new onset fever as well as neck stiffness. C-section was on 8/4. Reported headache and therefore underwent blood patch treatment on 8/6. Discharged home on 8/6 7. Return to the MAU on 8/10.  Discussed with neurologist Dr. Michaela was admitted to hospital on the obese side but later on OB requested a hospitalist admission with a consult. Underwent LP.  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 Started on acyclovir . ID following. Assessment and Plan: HSV encephalitis/meningitis. Currently reports improvement in headache with some blurred vision and photophobia. No neck stiffness. Started on IV acyclovir  with IV fluid. Appreciate ID consultation as well as neuroconsultation. Continue with IV acyclovir  with eventual plan to transition to oral Valtrex  for a total 7-day treatment course. Discontinue droplet precautions initiated as part of the meningitis order set. No indication for steroids. Monitor BMP.  Recent C-section. OB provider Dr. Diedre Sermon with Dr. Liguori (Neonatologist) regarding HSV2 encephalitis finding and concern for possible exposure for the baby. Per patient report, baby is acting normal and well. Dr. Maryland will call his pediatrician's office in the morning to make recommendation for outpatient HSV testing. If changes to baby's behavior or low temperature, he could be tested in the emergency room. Dr. Maryland also recommends HSV 2 IgG/IgM testing for Cythina as this would help stratify risk for [name].  This information was shared with patient.  Mild hypokalemia. Replacing orally.  Anemia, normocytic. H&H relatively stable. Initiating prenatal vitamins.  Constipation. Initiate bowel regimen.

## 2024-01-26 ENCOUNTER — Telehealth (HOSPITAL_COMMUNITY): Payer: Self-pay | Admitting: Pharmacy Technician

## 2024-01-26 ENCOUNTER — Other Ambulatory Visit (HOSPITAL_COMMUNITY): Payer: Self-pay

## 2024-01-26 DIAGNOSIS — B1009 Other human herpesvirus encephalitis: Secondary | ICD-10-CM | POA: Diagnosis not present

## 2024-01-26 LAB — CBC
HCT: 27.8 % — ABNORMAL LOW (ref 36.0–46.0)
Hemoglobin: 8.8 g/dL — ABNORMAL LOW (ref 12.0–15.0)
MCH: 27.1 pg (ref 26.0–34.0)
MCHC: 31.7 g/dL (ref 30.0–36.0)
MCV: 85.5 fL (ref 80.0–100.0)
Platelets: 270 K/uL (ref 150–400)
RBC: 3.25 MIL/uL — ABNORMAL LOW (ref 3.87–5.11)
RDW: 14.7 % (ref 11.5–15.5)
WBC: 8.1 K/uL (ref 4.0–10.5)
nRBC: 0 % (ref 0.0–0.2)

## 2024-01-26 LAB — MAGNESIUM: Magnesium: 1.5 mg/dL — ABNORMAL LOW (ref 1.7–2.4)

## 2024-01-26 LAB — BASIC METABOLIC PANEL WITH GFR
Anion gap: 10 (ref 5–15)
BUN: <5 mg/dL — ABNORMAL LOW (ref 6–20)
CO2: 19 mmol/L — ABNORMAL LOW (ref 22–32)
Calcium: 8.2 mg/dL — ABNORMAL LOW (ref 8.9–10.3)
Chloride: 109 mmol/L (ref 98–111)
Creatinine, Ser: 0.59 mg/dL (ref 0.44–1.00)
GFR, Estimated: >60 mL/min (ref >60)
Glucose, Bld: 88 mg/dL (ref 70–99)
Potassium: 3.7 mmol/L (ref 3.5–5.1)
Sodium: 138 mmol/L (ref 135–145)

## 2024-01-26 LAB — HSV 2 ANTIBODY, IGG: HSV 2 Glycoprotein G Ab, IgG: REACTIVE — AB

## 2024-01-26 MED ORDER — FERROUS SULFATE 325 (65 FE) MG PO TABS
325.0000 mg | ORAL_TABLET | Freq: Every day | ORAL | 0 refills | Status: AC
  Filled 2024-01-26: qty 30, 30d supply, fill #0

## 2024-01-26 MED ORDER — MAGNESIUM SULFATE 2 GM/50ML IV SOLN
2.0000 g | Freq: Once | INTRAVENOUS | Status: AC
  Administered 2024-01-26 (×2): 2 g via INTRAVENOUS
  Filled 2024-01-26: qty 50

## 2024-01-26 MED ORDER — VALACYCLOVIR HCL 1 G PO TABS
1000.0000 mg | ORAL_TABLET | Freq: Three times a day (TID) | ORAL | 0 refills | Status: AC
  Filled 2024-01-26: qty 18, 6d supply, fill #0

## 2024-01-26 MED ORDER — VALACYCLOVIR HCL 500 MG PO TABS
1000.0000 mg | ORAL_TABLET | Freq: Three times a day (TID) | ORAL | Status: DC
  Administered 2024-01-26 (×2): 1000 mg via ORAL
  Filled 2024-01-26: qty 2

## 2024-01-26 MED ORDER — POTASSIUM CHLORIDE CRYS ER 20 MEQ PO TBCR
40.0000 meq | EXTENDED_RELEASE_TABLET | Freq: Once | ORAL | Status: AC
  Administered 2024-01-26 (×2): 40 meq via ORAL
  Filled 2024-01-26: qty 2

## 2024-01-26 MED ORDER — B COMPLEX-C PO TABS
1.0000 | ORAL_TABLET | Freq: Every day | ORAL | 0 refills | Status: AC
  Filled 2024-01-26: qty 30, 30d supply, fill #0

## 2024-01-26 NOTE — Discharge Summary (Signed)
 Physician Discharge Summary   Patient: Kathy Harris MRN: [id number] DOB: 09/28/2000  Admit date:     01/24/2024  Discharge date: 01/26/24  Discharge Physician: Yetta Blanch  PCP: Patient, No Pcp Per  Recommendations at discharge: Follow-up with PCP in 1 week with a CBC and BMP.   Follow-up Information     PCP. Schedule an appointment as soon as possible for a visit in 1 week(s).   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 C-section was on 8/4. Reported headache and therefore underwent blood patch treatment on 8/6. Discharged home on 8/6 7. Return to the [facility] on 8/10.  Discussed with neurologist Dr. Michaela was admitted to hospital on the obese side but later on OB requested a hospitalist admission with a consult. Underwent LP.  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 Started on acyclovir . ID was also consulted. Assessment and Plan: HSV encephalitis/meningitis. Currently reports improvement in headache with some blurred vision and photophobia. No neck stiffness. Started on IV acyclovir  with IV fluid. Appreciate ID consultation as well as neuroconsultation. plan to transition to oral Valtrex  for a total 7-day treatment course.  Recent C-section. OB provider Dr. Diedre Sermon with Dr. Liguori (Neonatologist) regarding HSV2 encephalitis finding and concern for possible exposure for the baby. Per patient report, baby is acting normal and well. Dr. Maryland will call his pediatrician's office in the morning to make recommendation for outpatient HSV testing. If changes to baby's behavior or low temperature,  he could be tested in the emergency room. Dr. Maryland also recommends HSV 2 IgG/IgM testing for Kathy Harris as this would help stratify risk for [name].  This information was shared with patient. Per patient her child received some blood work yesterday.  Mild hypokalemia. Replacing orally.  Anemia, normocytic. H&H relatively stable. Initiating prenatal vitamins.  Constipation. Initiate bowel regimen.  Obesity. Class I. Body mass index is 38.38 kg/m.  Anticipating further improvement post pregnancy.  Consultants:  ID Neuro IR OB/GYN  Procedures performed:  LP  DISCHARGE MEDICATION: Allergies as of 01/26/2024   No Known Allergies      Medication List     TAKE these medications    acetaminophen -caffeine  500-65 MG Tabs per tablet Commonly known as: EXCEDRIN TENSION HEADACHE Take 1 tablet by mouth every 8 (eight) hours as needed.   B-complex with vitamin C tablet Take 1 tablet by mouth daily. Start taking on: January 27, 2024   cyclobenzaprine  5 MG tablet Commonly known as: FLEXERIL  Take 1 tablet (5 mg total) by mouth 3 (three) times daily as needed for up to 7 days for muscle spasms.   ferrous sulfate  325 (65 FE) MG tablet Take 1 tablet (325 mg total) by mouth daily with breakfast. Start taking on: January 27, 2024   valACYclovir  1000 MG tablet Commonly known as: VALTREX  Take 1 tablet (1,000 mg total) by mouth 3 (three) times daily for 6 days.       Disposition: Home Diet recommendation: Regular diet  Discharge Exam: Vitals:   01/25/24 1500 01/25/24 2020 01/26/24 0419 01/26/24 0741  BP: 126/77 129/83 114/75 126/82  Pulse:  78 64 66  Resp:  17 15 17   Temp:  99.1 F (37.3 C) 98.6 F (37 C) 98.8 F (37.1 C)  TempSrc:      SpO2:  100% 100% 98%  Weight:      Height:       Clear to auscultation noted S1-S2 present No edema. Able to ambulate without any pain.  Filed Weights   01/24/24 0427  Weight: 86.2 kg   Condition at discharge: stable  The  results of significant diagnostics from this hospitalization (including imaging, microbiology, ancillary and laboratory) are listed below for reference.   Imaging Studies: MR Lumbar Spine W Wo Contrast Result Date: 01/24/2024 EXAM: MRI LUMBAR SPINE 01/24/2024 04:33:03 PM TECHNIQUE: Multiplanar multisequence MRI of the lumbar spine was performed with and without the administration of intravenous contrast. 8.5mL of gadobutrol  (GADAVIST ) 1 MMOL/ML was injected. COMPARISON: None available. CLINICAL HISTORY: Abcess r/o. FINDINGS: BONES AND ALIGNMENT: Lumbar lordosis is maintained. No spondylolisthesis. No bone marrow edema or evidence of fracture. Vertebral body heights are maintained. No suspicious osseous lesion. SPINAL CORD: The conus medullaris terminates at the L1-2 level. SOFT TISSUES: There is heterogeneous signal intensity along the dorsal aspect of the spinal canal at L3-4 with a possible locule of gas within this region, likely reflecting post-procedure changes related to recent lumbar puncture and epidural blood patch. There is enhancement along the dorsal aspect of the spinal canal extending from the level of L2-3 to the mid L5 level, also likely reflecting post-procedure changes. Within this region, there is no evidence of peripheral enhancing fluid collection to suggest epidural abscess. The paraspinal soft tissues are otherwise unremarkable. L1-L2: No significant spinal canal or foraminal stenosis. L2-L3: No significant spinal canal or foraminal stenosis. L3-L4: No significant spinal canal or foraminal stenosis. L4-L5: No significant spinal canal or foraminal stenosis. L5-S1: There is prominence of the epidural fat with tapering of the thecal sac without high-grade spinal canal stenosis. No significant foraminal stenosis. IMPRESSION: 1. No evidence of epidural abscess. 2. Post-procedure changes related to recent lumbar puncture and epidural blood patch, with enhancement along the dorsal aspect of the  spinal canal from L2-3 to mid L5 level. 3. No high-grade spinal canal or foraminal stenosis. 4. Cholelithiasis. 5. Finding of epidural enhancement favored to reflect post procedure changes and no evidence of abscess discussed with Dr. Voncile at 6:17PM on 01/24/24. Electronically signed by: Donnice Mania MD 01/24/2024 06:46 PM EDT RP Workstation: [id number]   DG FL GUIDED LUMBAR PUNCTURE Result Date: 01/24/2024 CLINICAL DATA:  23 year old female with headaches, neck pain. IR was requested for LP for meningitis evaluation. EXAM: LUMBAR PUNCTURE UNDER FLUOROSCOPY PROCEDURE: An appropriate skin entry site was determined fluoroscopically. Operator donned sterile gloves and mask. Skin site was marked, then prepped with Betadine , draped in usual sterile fashion, and infiltrated locally with 1% lidocaine . A 20 gauge spinal needle advanced into the thecal sac at L4-L5 from a left interlaminar approach. Clear colorless CSF spontaneously returned, with opening pressure of 29 cm water. 8.5 ml CSF were collected and divided among 4 sterile vials for the requested laboratory studies. The needle was then removed. The patient tolerated the procedure well and there were no complications. FLUOROSCOPY: Radiation Exposure Index (as provided by the fluoroscopic device): 12.6 mGy Kerma IMPRESSION: Technically successful lumbar puncture under fluoroscopy. This exam was performed by Carlin Griffon, PA-C, and was supervised and interpreted by Dr. Juliene Balder. Electronically Signed   By: Juliene Balder M.D.   On: 01/24/2024 16:09  CT LUMBAR SPINE WO CONTRAST Result Date: 01/24/2024 EXAM: CT OF THE LUMBAR SPINE WITHOUT CONTRAST 01/24/2024 05:16:19 AM TECHNIQUE: CT of the lumbar spine was performed without the administration of intravenous contrast. Multiplanar reformatted images are provided for review. Automated exposure control, iterative reconstruction, and/or weight based adjustment of the mA/kV was utilized to reduce the radiation dose to  as low as reasonably achievable. COMPARISON: None available. CLINICAL HISTORY: S/p spinal for C/S. Concern for abscess/meningitis. Needs CT prior to LP. FINDINGS: BONES AND ALIGNMENT: Normal vertebral body heights. No acute fracture or suspicious bone lesion. Normal alignment. DEGENERATIVE CHANGES: Mild disc bulging is present at L3-4 and L4-5. SOFT TISSUES: Gas is present in the dorsal epidural space consistent with recent instrumentation. No definite fluid collection is present within the lumbar spinal canal. Blood products are present within an enlarged uterus consistent with recent c-section. IMPRESSION: 1. No definite fluid collection within the lumbar spinal canal. 2. Gas in the dorsal epidural space consistent with recent instrumentation. Electronically signed by: Lonni Necessary MD 01/24/2024 05:42 AM EDT RP Workstation: [id number]   CT HEAD WO CONTRAST ( ) Result Date: 01/24/2024 EXAM: CT HEAD WITHOUT CONTRAST 01/24/2024 05:15:59 AM TECHNIQUE: CT of the head was performed without the administration of intravenous contrast. Automated exposure control, iterative reconstruction, and/or weight based adjustment of the mA/kV was utilized to reduce the radiation dose to as low as reasonably achievable. COMPARISON: None available. CLINICAL HISTORY: Headache and fever. Status post spinal anesthesia for cesarean section. Concern for meningitis. FINDINGS: BRAIN AND VENTRICLES: No acute hemorrhage. Gray-white differentiation is preserved. No hydrocephalus. No extra-axial collection. No mass effect or midline shift. ORBITS: No acute abnormality. SINUSES: No acute abnormality. SOFT TISSUES AND SKULL: No acute soft tissue abnormality. No skull fracture. IMPRESSION: 1. No acute intracranial abnormality. Electronically signed by: Lonni Necessary MD 01/24/2024 05:39 AM EDT RP Workstation: [id number]    Microbiology: Results for orders placed or performed during the hospital encounter of 01/24/24  Culture,  blood (Routine X 2) w Reflex to ID Panel     Status: None (Preliminary result)   Collection Time: 01/24/24  4:56 AM   Specimen: BLOOD RIGHT HAND  Result Value Ref Range Status   Specimen Description BLOOD RIGHT HAND  Final   Special Requests   Final    BOTTLES DRAWN AEROBIC AND ANAEROBIC Blood Culture results may not be optimal due to an inadequate volume of blood received in culture bottles   Culture   Final    NO GROWTH 2 DAYS Performed at [facility], [address] [place], [place] [address]    Report Status PENDING  Incomplete  Culture, blood (Routine X 2) w Reflex to ID Panel     Status: None (Preliminary result)   Collection Time: 01/24/24  4:56 AM   Specimen: BLOOD RIGHT ARM  Result Value Ref Range Status   Specimen Description BLOOD RIGHT ARM  Final   Special Requests   Final    BOTTLES DRAWN AEROBIC AND ANAEROBIC Blood Culture results may not be optimal due to an inadequate volume of blood received in culture bottles   Culture   Final    NO GROWTH 2 DAYS Performed at [facility], [address] [place], [place] [address]    Report Status PENDING  Incomplete  Respiratory (~20 pathogens) panel by PCR     Status: None   Collection Time: 01/24/24 11:16 AM   Specimen: Nasopharyngeal Swab; Respiratory  Result Value Ref Range Status   Adenovirus  NOT DETECTED NOT DETECTED Final   Coronavirus 229E NOT DETECTED NOT DETECTED Final    Comment: (NOTE) The Coronavirus on the Respiratory Panel, DOES NOT test for the novel  Coronavirus (2019 nCoV)    Coronavirus HKU1 NOT DETECTED NOT DETECTED Final   Coronavirus NL63 NOT DETECTED NOT DETECTED Final   Coronavirus OC43 NOT DETECTED NOT DETECTED Final   Metapneumovirus NOT DETECTED NOT DETECTED Final   Rhinovirus / Enterovirus NOT DETECTED NOT DETECTED Final   Influenza A NOT DETECTED NOT DETECTED Final   Influenza B NOT DETECTED NOT DETECTED Final   Parainfluenza Virus 1 NOT DETECTED NOT DETECTED Final    Parainfluenza Virus 2 NOT DETECTED NOT DETECTED Final   Parainfluenza Virus 3 NOT DETECTED NOT DETECTED Final   Parainfluenza Virus 4 NOT DETECTED NOT DETECTED Final   Respiratory Syncytial Virus NOT DETECTED NOT DETECTED Final   Bordetella pertussis NOT DETECTED NOT DETECTED Final   Bordetella Parapertussis NOT DETECTED NOT DETECTED Final   Chlamydophila pneumoniae NOT DETECTED NOT DETECTED Final   Mycoplasma pneumoniae NOT DETECTED NOT DETECTED Final    Comment: Performed at [facility], [address], [place], [place] [address]  CSF culture w Gram Stain     Status: None (Preliminary result)   Collection Time: 01/24/24  1:12 PM   Specimen: Lumbar Puncture; Cerebrospinal Fluid  Result Value Ref Range Status   Specimen Description CSF  Final   Special Requests NONE  Final   Gram Stain   Final    WBC PRESENT,BOTH PMN AND MONONUCLEAR NO ORGANISMS SEEN CYTOSPIN SMEAR    Culture   Final    NO GROWTH 2 DAYS Performed at [facility], [address], [place], [place] [address]    Report Status PENDING  Incomplete   Labs: CBC: Recent Labs  Lab 01/20/24 1030 01/22/24 1203 01/24/24 0623 01/26/24 0556  WBC 11.1* 9.5 12.6* 8.1  NEUTROABS 8.5* 7.6  --   --   HGB 8.9* 9.0* 9.2* 8.8*  HCT 27.9* 28.3* 29.1* 27.8*  MCV 85.1 86.0 85.3 85.5  PLT 192 217 310 270   Basic Metabolic Panel: Recent Labs  Lab 01/20/24 1030 01/24/24 0505 01/25/24 0708 01/26/24 0556  NA 137 138 137 138  K 4.0 3.9 3.4* 3.7  CL 107 107 109 109  CO2 22 18* 19* 19*  GLUCOSE 75 91 99 88  BUN 6 6 <5* <5*  CREATININE 0.76 0.77 0.67 0.59  CALCIUM 8.5* 8.3* 8.0* 8.2*  MG  --   --   --  1.5*   Liver Function Tests: Recent Labs  Lab 01/20/24 1030 01/24/24 0505  AST 51* 23  ALT 59* 24  ALKPHOS 126 87  BILITOT 0.5 0.6  PROT 5.5* 5.5*  ALBUMIN 1.9* 2.0*   CBG: No results for input(s): GLUCAP in the last 168 hours.  Discharge time spent: greater than 30 minutes.  Author: Yetta Blanch, MD  [facility]

## 2024-01-26 NOTE — Progress Notes (Addendum)
 [facility]    Date of Admission:  01/24/2024    Total days of antibiotics  2 doses of cefepime  and 1 vancomycin  Day of acyclovir            ID: Kathy Harris is a 23 y.o. female  with PMH of anemia, s/p C- section POD 7 who presents with headache, neck stiffness, and photophobia and admitted for meningitis work up and treatment .  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 She denies any vision changes or photophobia. Denies any headache or neck stiffness. She says that she feels well enough to go home. Patient reports that she does not plan on using her breast milk at this time, although she is advised that she is safe to give breast milk while on the medications she is taking.  Patient has been afebrile for the last 24 hours and does not endorse any fever/chills. No other complaints at this time.   Medications:   B-complex with vitamin C  1 tablet Oral Daily   ferrous sulfate   325 mg Oral Q breakfast   potassium chloride   40 mEq Oral Once   valACYclovir   1,000 mg Oral TID    Objective: Vital signs in last 24 hours: Temp:  [97.8 F (36.6 C)-99.1 F (37.3 C)] 98.8 F (37.1 C) (08/12 0741) Pulse Rate:  [64-87] 66 (08/12 0741) Resp:  [15-17] 17 (08/12 0741) BP: (114-129)/(75-84) 126/82 (08/12 0741) SpO2:  [95 %-100 %] 98 % (08/12 0741)  Physical Exam    Lab Results Recent Labs    01/24/24 0623 01/25/24 0708 01/26/24 0556  WBC 12.6*  --  8.1  HGB 9.2*  --  8.8*  HCT 29.1*  --  27.8*  NA  --  137 138  K  --  3.4* 3.7  CL  --  109 109  CO2  --  19* 19*  BUN  --  <5* <5*  CREATININE  --  0.67 0.59   Liver Panel Recent Labs    01/24/24 0505  PROT 5.5*  ALBUMIN 2.0*  AST 23  ALT 24  ALKPHOS 87  BILITOT 0.6   Sedimentation Rate No results for input(s): ESRSEDRATE in the last 72 hours. C-Reactive Protein Recent Labs     01/24/24 0505  CRP 5.5*    Microbiology:  Studies/Results: MR Lumbar Spine W Wo Contrast Result Date: 01/24/2024 EXAM: MRI LUMBAR SPINE 01/24/2024 04:33:03 PM TECHNIQUE: Multiplanar multisequence MRI of the lumbar spine was performed with and without the administration of intravenous contrast. 8.5mL of gadobutrol  (GADAVIST ) 1 MMOL/ML was injected. COMPARISON: None available. CLINICAL HISTORY: Abcess r/o. FINDINGS: BONES AND ALIGNMENT: Lumbar lordosis is maintained. No spondylolisthesis. No bone marrow edema or evidence of fracture. Vertebral body heights are maintained. No suspicious osseous lesion. SPINAL CORD: The conus medullaris terminates at the L1-2 level. SOFT TISSUES: There is heterogeneous signal intensity along the dorsal aspect of the spinal canal at L3-4 with a possible locule of gas within this region, likely reflecting post-procedure changes related to recent lumbar puncture and epidural blood patch. There is enhancement along the dorsal aspect of the spinal canal extending from the level of L2-3 to the mid L5 level, also likely reflecting post-procedure changes. Within this region, there is no evidence of peripheral enhancing fluid collection to suggest epidural abscess. The paraspinal soft tissues are otherwise unremarkable. L1-L2: No significant  spinal canal or foraminal stenosis. L2-L3: No significant spinal canal or foraminal stenosis. L3-L4: No significant spinal canal or foraminal stenosis. L4-L5: No significant spinal canal or foraminal stenosis. L5-S1: There is prominence of the epidural fat with tapering of the thecal sac without high-grade spinal canal stenosis. No significant foraminal stenosis. IMPRESSION: 1. No evidence of epidural abscess. 2. Post-procedure changes related to recent lumbar puncture and epidural blood patch, with enhancement along the dorsal aspect of the spinal canal from L2-3 to mid L5 level. 3. No high-grade spinal canal or foraminal stenosis. 4.  Cholelithiasis. 5. Finding of epidural enhancement favored to reflect post procedure changes and no evidence of abscess discussed with Dr. Voncile at 6:17PM on 01/24/24. Electronically signed by: Donnice Mania MD 01/24/2024 06:46 PM EDT RP Workstation: [id number]   DG FL GUIDED LUMBAR PUNCTURE Result Date: 01/24/2024 CLINICAL DATA:  23 year old female with headaches, neck pain. IR was requested for LP for meningitis evaluation. EXAM: LUMBAR PUNCTURE UNDER FLUOROSCOPY PROCEDURE: An appropriate skin entry site was determined fluoroscopically. Operator donned sterile gloves and mask. Skin site was marked, then prepped with Betadine , draped in usual sterile fashion, and infiltrated locally with 1% lidocaine . A 20 gauge spinal needle advanced into the thecal sac at L4-L5 from a left interlaminar approach. Clear colorless CSF spontaneously returned, with opening pressure of 29 cm water. 8.5 ml CSF were collected and divided among 4 sterile vials for the requested laboratory studies. The needle was then removed. The patient tolerated the procedure well and there were no complications. FLUOROSCOPY: Radiation Exposure Index (as provided by the fluoroscopic device): 12.6 mGy Kerma IMPRESSION: Technically successful lumbar puncture under fluoroscopy. This exam was performed by Carlin Griffon, PA-C, and was supervised and interpreted by Dr. Juliene Balder. Electronically Signed   By: Juliene Balder M.D.   On: 01/24/2024 16:09     Assessment/Plan:  Kathy Harris is a 23 y.o. female with PMH of anemia, s/p C- section POD 7 who presents with headache, neck stiffness, and photophobia and admitted for meningitis work up and treatment .    #HSV-2 Encephalitis  S/p c- section with epidural placement POD 8  Patient is doing well this morning. She endorses resolution and improvement of her symptoms. She has been afebrile for the last 24 hours. Patient has been able to tolerate PO intake.   - CSF fluid analysis showed lymphocyte  predominance at 60%, protein 56, WBC showed 223 and 237. These results with lymphocyte predominance and mildly elevated protein are consistent with viral or aseptic meningitis.  -CSF culture with gram stain showed WBC present, with both PMN and mononuclear cells but no organisms seen.  -Blood cultures show no growth at 2 days  - No evidence of abscess on MRI of lumbar spine. Post procedural changes noted on imaging.   Plan: - Transitioned to valacyclovir  1 g TID  PO starting today for 6 days. Patient received 1 day of IV acyclovir .  - Negative blood cultures and symptom improvement are reassuring. - Have added information for ID clinic to discharge information, if any needs arise.   Will sign off at this time [facility] Pager: [phone]  01/26/2024, 9:33 AM

## 2024-01-26 NOTE — Plan of Care (Signed)

## 2024-01-26 NOTE — Telephone Encounter (Signed)
 Patient Product/process development scientist completed.    The patient is insured through [facility] MEDICAID.     Ran test claim for valacyclovir  100 mg and the current 6 day co-pay is $4.00.   This test claim was processed through [facility]- copay amounts may vary at other pharmacies due to pharmacy/plan contracts, or as the patient moves through the different stages of their insurance plan.     Reyes Sharps, CPHT Pharmacy Technician III Certified Patient Advocate [facility] Patient Advocate Team Direct Number: [phone]  Fax: [phone]

## 2024-01-26 NOTE — Progress Notes (Signed)
 Reviewed AVS, patient expressed understanding of medications, MD follow up reviewed.   See LDA for information on wounds at discharge. CCMD contacted and informed patients is being discharged.  Patient states all belongings brought to the hospital at time of admission are accounted for and packed to take home.  Patient informed and expressed understanding where to pick up discharge medications.  [facility] contacted to transport patient to entrance A where family member was waiting in vehicle to transport home.

## 2024-01-28 LAB — CSF CULTURE W GRAM STAIN: Culture: NO GROWTH

## 2024-01-29 LAB — CULTURE, BLOOD (ROUTINE X 2)
Culture: NO GROWTH
Culture: NO GROWTH

## 2024-02-01 ENCOUNTER — Telehealth (HOSPITAL_COMMUNITY): Payer: Self-pay | Admitting: *Deleted

## 2024-02-01 NOTE — Telephone Encounter (Signed)
 02/01/2024  Name: Kathy Harris MRN: [id number] DOB: 2000-09-12  Reason for Call:  Transition of Care Hospital Discharge Call  Contact Status: Patient Contact Status: Complete  Language assistant needed: Interpreter Mode: Interpreter Not Needed        Follow-Up Questions: Do You Have Any Concerns About Your Health As You Heal From Delivery?: No Do You Have Any Concerns About Your Infants Health?: No  Edinburgh Postnatal Depression Scale:  In the Past 7 Days: I have been able to laugh and see the funny side of things.: As much as I always could I have looked forward with enjoyment to things.: As much as I ever did I have blamed myself unnecessarily when things went wrong.: No, never I have been anxious or worried for no good reason.: No, not at all I have felt scared or panicky for no good reason.: No, not at all Things have been getting on top of me.: No, I have been coping as well as ever I have been so unhappy that I have had difficulty sleeping.: Not at all I have felt sad or miserable.: No, not at all I have been so unhappy that I have been crying.: No, never The thought of harming myself has occurred to me.: Never [place] Postnatal Depression Scale Total: 0  PHQ2-9 Depression Scale:     Discharge Follow-up: Edinburgh score requires follow up?: No Patient was advised of the following resources:: Breastfeeding Support Group, Support Group  Post-discharge interventions: Reviewed Newborn Safe Sleep Practices  Mliss Sieve, RN 02/01/2024 14:57
